# Patient Record
Sex: Female | Born: 1980 | Race: Black or African American | Hispanic: No | Marital: Married | Smoking: Never smoker
Health system: Southern US, Community
[De-identification: ages and names within clinical notes are randomized; demographics above are authoritative.]

## PROBLEM LIST (undated history)

## (undated) ENCOUNTER — Inpatient Hospital Stay (HOSPITAL_COMMUNITY): Payer: Self-pay

## (undated) DIAGNOSIS — I889 Nonspecific lymphadenitis, unspecified: Secondary | ICD-10-CM

## (undated) DIAGNOSIS — I499 Cardiac arrhythmia, unspecified: Secondary | ICD-10-CM

## (undated) DIAGNOSIS — F32A Depression, unspecified: Secondary | ICD-10-CM

## (undated) DIAGNOSIS — I10 Essential (primary) hypertension: Secondary | ICD-10-CM

## (undated) DIAGNOSIS — Z8669 Personal history of other diseases of the nervous system and sense organs: Secondary | ICD-10-CM

## (undated) DIAGNOSIS — N200 Calculus of kidney: Secondary | ICD-10-CM

## (undated) DIAGNOSIS — T7840XA Allergy, unspecified, initial encounter: Secondary | ICD-10-CM

## (undated) DIAGNOSIS — O24419 Gestational diabetes mellitus in pregnancy, unspecified control: Secondary | ICD-10-CM

## (undated) DIAGNOSIS — R011 Cardiac murmur, unspecified: Secondary | ICD-10-CM

## (undated) DIAGNOSIS — K5792 Diverticulitis of intestine, part unspecified, without perforation or abscess without bleeding: Secondary | ICD-10-CM

## (undated) DIAGNOSIS — F329 Major depressive disorder, single episode, unspecified: Secondary | ICD-10-CM

## (undated) HISTORY — DX: Cardiac murmur, unspecified: R01.1

## (undated) HISTORY — PX: BREAST CYST EXCISION: SHX579

## (undated) HISTORY — PX: WISDOM TOOTH EXTRACTION: SHX21

## (undated) HISTORY — DX: Personal history of other diseases of the nervous system and sense organs: Z86.69

## (undated) HISTORY — DX: Diverticulitis of intestine, part unspecified, without perforation or abscess without bleeding: K57.92

## (undated) HISTORY — DX: Gestational diabetes mellitus in pregnancy, unspecified control: O24.419

## (undated) HISTORY — DX: Allergy, unspecified, initial encounter: T78.40XA

---
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
## 2004-08-26 ENCOUNTER — Inpatient Hospital Stay (HOSPITAL_COMMUNITY): Admission: AD | Admit: 2004-08-26 | Discharge: 2004-08-26 | Payer: Self-pay | Admitting: *Deleted

## 2004-08-30 ENCOUNTER — Inpatient Hospital Stay (HOSPITAL_COMMUNITY): Admission: AD | Admit: 2004-08-30 | Discharge: 2004-08-30 | Payer: Self-pay | Admitting: Obstetrics and Gynecology

## 2004-09-06 ENCOUNTER — Inpatient Hospital Stay (HOSPITAL_COMMUNITY): Admission: AD | Admit: 2004-09-06 | Discharge: 2004-09-06 | Payer: Self-pay | Admitting: Obstetrics & Gynecology

## 2004-09-10 ENCOUNTER — Inpatient Hospital Stay (HOSPITAL_COMMUNITY): Admission: AD | Admit: 2004-09-10 | Discharge: 2004-09-10 | Payer: Self-pay | Admitting: *Deleted

## 2004-09-13 ENCOUNTER — Inpatient Hospital Stay (HOSPITAL_COMMUNITY): Admission: AD | Admit: 2004-09-13 | Discharge: 2004-09-13 | Payer: Self-pay | Admitting: Obstetrics and Gynecology

## 2004-12-14 ENCOUNTER — Other Ambulatory Visit: Admission: RE | Admit: 2004-12-14 | Discharge: 2004-12-14 | Payer: Self-pay | Admitting: Obstetrics and Gynecology

## 2005-07-01 ENCOUNTER — Inpatient Hospital Stay (HOSPITAL_COMMUNITY): Admission: AD | Admit: 2005-07-01 | Discharge: 2005-07-03 | Payer: Self-pay | Admitting: Obstetrics and Gynecology

## 2005-12-27 ENCOUNTER — Other Ambulatory Visit: Admission: RE | Admit: 2005-12-27 | Discharge: 2005-12-27 | Payer: Self-pay | Admitting: Obstetrics and Gynecology

## 2009-09-03 ENCOUNTER — Inpatient Hospital Stay (HOSPITAL_COMMUNITY): Admission: RE | Admit: 2009-09-03 | Discharge: 2009-09-05 | Payer: Self-pay | Admitting: Obstetrics and Gynecology

## 2010-06-25 ENCOUNTER — Ambulatory Visit: Payer: Self-pay | Admitting: Diagnostic Radiology

## 2010-06-25 ENCOUNTER — Emergency Department (HOSPITAL_BASED_OUTPATIENT_CLINIC_OR_DEPARTMENT_OTHER): Admission: EM | Admit: 2010-06-25 | Discharge: 2010-06-25 | Payer: Self-pay | Admitting: Emergency Medicine

## 2011-01-06 LAB — BASIC METABOLIC PANEL WITH GFR
BUN: 9 mg/dL (ref 6–23)
CO2: 27 meq/L (ref 19–32)
Calcium: 9 mg/dL (ref 8.4–10.5)
Chloride: 107 meq/L (ref 96–112)
Creatinine, Ser: 0.7 mg/dL (ref 0.4–1.2)
GFR calc Af Amer: >60 mL/min (ref >60)
GFR calc non Af Amer: >60 mL/min (ref >60)
Glucose, Bld: 101 mg/dL — ABNORMAL HIGH (ref 70–99)
Potassium: 3.7 meq/L (ref 3.5–5.1)
Sodium: 142 meq/L (ref 135–145)

## 2011-01-06 LAB — CBC
HCT: 40.8 % (ref 36.0–46.0)
Hemoglobin: 13.3 g/dL (ref 12.0–15.0)
RBC: 4.64 MIL/uL (ref 3.87–5.11)
RDW: 13.1 % (ref 11.5–15.5)
WBC: 6 10*3/uL (ref 4.0–10.5)

## 2011-01-06 LAB — DIFFERENTIAL
Basophils Absolute: 0.1 K/uL (ref 0.0–0.1)
Basophils Relative: 1 % (ref 0–1)
Eosinophils Absolute: 0.2 K/uL (ref 0.0–0.7)
Eosinophils Relative: 3 % (ref 0–5)
Lymphocytes Relative: 43 % (ref 12–46)
Lymphs Abs: 2.6 K/uL (ref 0.7–4.0)
Monocytes Absolute: 0.4 K/uL (ref 0.1–1.0)
Monocytes Relative: 7 % (ref 3–12)
Neutro Abs: 2.7 K/uL (ref 1.7–7.7)
Neutrophils Relative %: 47 % (ref 43–77)

## 2011-01-26 LAB — CBC
HCT: 35.1 % — ABNORMAL LOW (ref 36.0–46.0)
HCT: 39.8 % (ref 36.0–46.0)
Hemoglobin: 13.2 g/dL (ref 12.0–15.0)
MCHC: 33.2 g/dL (ref 30.0–36.0)
MCV: 94 fL (ref 78.0–100.0)
Platelets: 159 10*3/uL (ref 150–400)
RBC: 4.31 MIL/uL (ref 3.87–5.11)
WBC: 10.2 10*3/uL (ref 4.0–10.5)

## 2011-03-11 NOTE — Discharge Summary (Signed)
NAMEDONN, [name]              ACCOUNT NO.:  [id number]   MEDICAL RECORD NO.:  [id number]          PATIENT TYPE:  INP   LOCATION:  9119                          FACILITY:  [facility]   PHYSICIAN:  Huel Cote, M.D. DATE OF BIRTH:  1981/03/23   DATE OF ADMISSION:  07/01/2005  DATE OF DISCHARGE:  07/03/2005                                 DISCHARGE SUMMARY   DISCHARGE DIAGNOSES:  1.  Term pregnancy at 40 weeks delivered.  2.  Group B strep positive status.  3.  Status post normal spontaneous vaginal delivery.  4.  Meconium-stained fluid.   DISCHARGE MEDICATIONS:  1.  Motrin 600 mg p.o. every 6 hours  2.  Percocet 1 to 2 tablets p.o. every 4 hours p.r.n.   DISCHARGE FOLLOWUP:  The patient is to follow-up in the office in  approximately 6 weeks for routine postpartum exam.   HOSPITAL COURSE:  The patient is a 30 year old gravida 4, para 2-0-1-2 who  was admitted 40 plus weeks gestation for induction of labor given post  dates. Her prenatal care had been complicated only by group B strep positive  status.   PRENATAL LABS:  O+ antibody negative, sickle normal, RPR nonreactive,  rubella equivocal, hepatitis B surface antigen negative, HIV nonreactive, GC  negative, chlamydia negative, group B strep positive, 1-hour Glucola 108.   PAST OBSTETRICAL HISTORY:  In 2001 she had a vaginal delivery of 8 pounds 6  ounces infant. In 2004 she had a vaginal delivery of 7 pounds 6 ounces  infant. She had one spontaneous miscarriage at 8 weeks.   PAST GYN HISTORY:  None.   PAST MEDICAL HISTORY:  Anemia.   SURGICAL HISTORY:  None.   ALLERGIES:  No known drug allergies. She did have some GI intolerance to  p.o. PENICILLIN.   MEDICATIONS:  She was taking no medications on admission.   HOSPITAL COURSE:  She was afebrile with stable vital signs. Fetal heart rate  was reactive. Cervical exam was 50, 1+, and -1 station. She had rupture of  membranes performed with light meconium stained fluid  noted. She was placed  on penicillin for the group B strep positive status. She was placed on  Pitocin and progressed well reaching complete dilation and pushed once for a  normal spontaneous vaginal delivery of a viable female infant over intact  perineum. Apgars were 6 and 8. Weight was 8 pounds 3 ounces. There was a  nuchal cord x1. The infant was DeLee suctioned after delivery of head for  thin meconium and did very well. It was not felt necessary for pediatrician  to be  in attendance. Placenta delivered spontaneously and the patient had no  lacerations. She did very well with her postpartum course. On postpartum day  #2 and normal lochia. Her fundus firm and she was afebrile with stable vital  signs. Discharge hemoglobin was 11.5 and she was felt stable for discharge  home.      Huel Cote, M.D.  Electronically Signed     KR/MEDQ  D:  07/03/2005  T:  07/03/2005  Job:  [id number]

## 2011-03-24 ENCOUNTER — Inpatient Hospital Stay (HOSPITAL_COMMUNITY)
Admission: AD | Admit: 2011-03-24 | Discharge: 2011-03-24 | Disposition: A | Discharge status: Home / Self Care | Payer: Self-pay | Source: Ambulatory Visit | Attending: Obstetrics and Gynecology | Admitting: Obstetrics and Gynecology

## 2011-03-24 ENCOUNTER — Inpatient Hospital Stay (HOSPITAL_COMMUNITY): Payer: Self-pay

## 2011-03-24 DIAGNOSIS — O200 Threatened abortion: Secondary | ICD-10-CM

## 2011-03-24 DIAGNOSIS — R58 Hemorrhage, not elsewhere classified: Secondary | ICD-10-CM

## 2011-03-24 LAB — HCG, QUANTITATIVE, PREGNANCY: hCG, Beta Chain, Quant, S: 5316 m[IU]/mL — ABNORMAL HIGH (ref <5)

## 2011-03-24 LAB — URINALYSIS, ROUTINE W REFLEX MICROSCOPIC
Bilirubin Urine: NEGATIVE
Specific Gravity, Urine: 1.025 (ref 1.005–1.030)
Urobilinogen, UA: 0.2 mg/dL (ref 0.0–1.0)
pH: 6 (ref 5.0–8.0)

## 2011-03-24 LAB — WET PREP, GENITAL
Clue Cells Wet Prep HPF POC: NONE SEEN
Trich, Wet Prep: NONE SEEN
Yeast Wet Prep HPF POC: NONE SEEN

## 2011-03-24 LAB — CBC
Hemoglobin: 12.3 g/dL (ref 12.0–15.0)
RBC: 4.23 MIL/uL (ref 3.87–5.11)

## 2011-03-24 LAB — ABO/RH: ABO/RH(D): O POS

## 2011-03-24 LAB — URINE MICROSCOPIC-ADD ON

## 2011-03-31 ENCOUNTER — Other Ambulatory Visit (HOSPITAL_COMMUNITY): Payer: Self-pay | Admitting: Obstetrics and Gynecology

## 2011-03-31 DIAGNOSIS — IMO0002 Reserved for concepts with insufficient information to code with codable children: Secondary | ICD-10-CM

## 2011-04-01 ENCOUNTER — Other Ambulatory Visit (HOSPITAL_COMMUNITY): Payer: Self-pay | Admitting: Obstetrics and Gynecology

## 2011-04-01 ENCOUNTER — Ambulatory Visit (HOSPITAL_COMMUNITY): Admission: RE | Admit: 2011-04-01 | Payer: 59 | Source: Ambulatory Visit

## 2011-04-01 ENCOUNTER — Ambulatory Visit (HOSPITAL_COMMUNITY)
Admission: RE | Admit: 2011-04-01 | Discharge: 2011-04-01 | Disposition: A | Discharge status: Home / Self Care | Payer: 59 | Source: Ambulatory Visit | Attending: Obstetrics and Gynecology | Admitting: Obstetrics and Gynecology

## 2011-04-01 DIAGNOSIS — N938 Other specified abnormal uterine and vaginal bleeding: Secondary | ICD-10-CM | POA: Insufficient documentation

## 2011-04-01 DIAGNOSIS — IMO0002 Reserved for concepts with insufficient information to code with codable children: Secondary | ICD-10-CM

## 2011-04-01 DIAGNOSIS — N949 Unspecified condition associated with female genital organs and menstrual cycle: Secondary | ICD-10-CM | POA: Insufficient documentation

## 2011-04-11 ENCOUNTER — Ambulatory Visit (HOSPITAL_COMMUNITY): Payer: 59

## 2011-10-25 NOTE — L&D Delivery Note (Signed)
Delivery Note I was called for delivery of pt who had epidural but was feeling pressure.  I came immediately but on reaching room within 10 minutes baby was already in the warmer and Dr. Adrian Blackwater had attended the delivery. Pt had unbearable pressure and Dr. Adrian Blackwater called while I was en route. At 4:53 PM a healthy female was delivered via Vaginal, Spontaneous Delivery (Presentation: ; Occiput Anterior).  APGAR: 7, 9; weight pending.   Placenta status: Intact, Spontaneous.  Cord: 3 vessels with the following complications: Nuchal; x 1.  Cord blood was collected and I delivered placenta with no difficulty. Anesthesia: Epidural  Episiotomy: None Lacerations: None Suture Repair: n/a Est. Blood Loss (mL): 350cc  Mom to postpartum.  Baby with mother in room.  [name] 05/22/2012, 5:13 PM

## 2011-10-28 LAB — OB RESULTS CONSOLE HIV ANTIBODY (ROUTINE TESTING): HIV: NONREACTIVE

## 2011-10-28 LAB — OB RESULTS CONSOLE RUBELLA ANTIBODY, IGM: Rubella: IMMUNE

## 2011-10-28 LAB — OB RESULTS CONSOLE ABO/RH

## 2012-01-30 ENCOUNTER — Ambulatory Visit: Payer: 59 | Admitting: Internal Medicine

## 2012-02-09 HISTORY — PX: TRANSTHORACIC ECHOCARDIOGRAM: SHX275

## 2012-02-15 ENCOUNTER — Inpatient Hospital Stay (HOSPITAL_COMMUNITY)
Admission: AD | Admit: 2012-02-15 | Discharge: 2012-02-15 | Disposition: A | Discharge status: Home / Self Care | Payer: 59 | Source: Ambulatory Visit | Attending: Obstetrics and Gynecology | Admitting: Obstetrics and Gynecology

## 2012-02-15 ENCOUNTER — Encounter (HOSPITAL_COMMUNITY): Payer: Self-pay | Admitting: *Deleted

## 2012-02-15 DIAGNOSIS — R5381 Other malaise: Secondary | ICD-10-CM | POA: Insufficient documentation

## 2012-02-15 DIAGNOSIS — R531 Weakness: Secondary | ICD-10-CM

## 2012-02-15 DIAGNOSIS — R5383 Other fatigue: Secondary | ICD-10-CM

## 2012-02-15 DIAGNOSIS — M7989 Other specified soft tissue disorders: Secondary | ICD-10-CM

## 2012-02-15 DIAGNOSIS — IMO0002 Reserved for concepts with insufficient information to code with codable children: Secondary | ICD-10-CM | POA: Insufficient documentation

## 2012-02-15 HISTORY — DX: Cardiac arrhythmia, unspecified: I49.9

## 2012-02-15 NOTE — MAU Note (Signed)
Cardiologist is at [facility]

## 2012-02-15 NOTE — MAU Note (Addendum)
While pt was at work about 1500 felt heart racing, SOB, diaphoretic, and pain on rt low abd.  Pt. States BP 140/91 with HR of 148 at that time and currently wears a heart monitor.  Pt call Cardiologist with [facility] and is awaiting return call when [facility] office instructed her to come to MAU.  Pt says the SOB has lessened and is much cooler now.

## 2012-02-15 NOTE — MAU Provider Note (Signed)
History     CSN: [id number]  Arrival date and time: 02/15/12 1719   First Provider Initiated Contact with Patient 02/15/12 1806      No chief complaint on file.  HPI [name] Guerra 31 y.o. [redacted]w[redacted]d  Comes to MAU after leaving work at [employer].  Had just arrived at work when began experiencing being hot, sweating, and feeling like her chest was heavy.  Took her vital signs and her blood pressure was 141/90 and HR was 148.  On arrival to MAU BP was 136/76 and HR was 116 after driving herself to pick up her 4 children and coming to [facility].  Has swollen left leg and long history of swelling in that leg - possible lymphedema since a teen.  OB History    Grav Para Term Preterm Abortions TAB SAB Ect Mult Living   7 4   2  2   4       Past Medical History  Diagnosis Date  . Dysrhythmia     Past Surgical History  Procedure Date  . Breast cyst excision   . Wisdom tooth extraction     Family History  Problem Relation Age of Onset  . Anesthesia problems Neg Hx     History  Substance Use Topics  . Smoking status: Never Smoker   . Smokeless tobacco: Never Used  . Alcohol Use: No    Allergies: No Known Allergies  No prescriptions prior to admission    Review of Systems  Constitutional: Positive for diaphoresis.  Cardiovascular:       Heaviness in chest Swelling in left leg High heart rate and blood pressure  Neurological: Positive for weakness.   Physical Exam   Blood pressure 136/76, pulse 116, temperature 97.8 F (36.6 C), temperature source Oral, resp. rate 20, height 5\' 7"  (1.702 m), weight 242 lb (109.77 kg), last menstrual period 08/18/2011, SpO2 100.00%.  Physical Exam  Nursing note and vitals reviewed. Constitutional: She is oriented to person, place, and time. She appears well-developed and well-nourished.  HENT:  Head: Normocephalic.  Eyes: EOM are normal.  Neck: Neck supple.  GI: Soft. There is no tenderness.       No contractions  seen on monitor FHT baseline 145 with 10x10 accels noted - reassuring  Musculoskeletal: Normal range of motion.       Left leg obviously more edematous than right  Neurological: She is alert and oriented to person, place, and time.  Skin: Skin is warm and dry.  Psychiatric: She has a normal mood and affect.    MAU Course  Procedures Results for orders placed during the hospital encounter of 02/15/12 (from the past 24 hour(s))  GLUCOSE, CAPILLARY     Status: Normal   Collection Time   02/15/12  5:51 PM      Component Value Range   Glucose-Capillary 95  70 - 99 (mg/dL)    MDM 7829  Consult with Dr. Ellyn Hack re: plan of care Client was seen on Thursday at [facility] for echo on heart and doppler of left leg  Assessment and Plan  Pregnancy 25 weeks Episode of weakness - currently stable with BP around 120/80 and heart rate of 97-103. Left leg swelling  Plan Keep your appointments in the office as scheduled Call your doctor if you have questions or concerns. Decrease carbohydrates in your diet and eat protein every time you eat. - examples are meat, milk, cheese, eggs, nuts, peanut butter Stop sugary  drinks.  They may be contributing to a high blood sugar that drops and leaves you feeling weak.  Yaremi Stahlman 02/15/2012, 6:13 PM

## 2012-02-15 NOTE — Discharge Instructions (Signed)
Keep your appointments in the office as scheduled Call your doctor if you have questions or concerns. Decrease carbohydrates in your diet and eat protein every time you eat. - examples are meat, milk, cheese, eggs, nuts, peanut butter Stop sugary drinks.  They may be contributing to a high blood sugar that drops and leaves you feeling weak.

## 2012-04-25 ENCOUNTER — Ambulatory Visit: Payer: 59

## 2012-05-11 ENCOUNTER — Telehealth (HOSPITAL_COMMUNITY): Payer: Self-pay | Admitting: *Deleted

## 2012-05-11 ENCOUNTER — Encounter (HOSPITAL_COMMUNITY): Payer: Self-pay | Admitting: *Deleted

## 2012-05-11 NOTE — Telephone Encounter (Signed)
Preadmission screen  

## 2012-05-21 NOTE — H&P (Signed)
[name] Guerra is a 31 y.o. female  [phone] at 39+weeks (EDD by LMP c/w 8 week [place]) presents for induction of labor at term with favorable cervix.  Prenatal care complicated by gestational diabetes that is well-controlled by diet. She also had palpitations early in pregnacy with a negative cardiology w/u.  No other significant issues.  Maternal Medical History:  Prenatal Complications - Diabetes: gestational. Diabetes is managed by diet.      OB History    Grav Para Term Preterm Abortions TAB SAB Ect Mult Living   7 4 4  2  2   4     2001 NSVD 8#6oz 2004 NSVD 7#6oz 2005 SAB 2006 NSVD 8#3oz 2010 NSVD 8#2oz 2012 SAB  Past Medical History  Diagnosis Date  . Dysrhythmia   . Gestational diabetes     diet controlled  . Diverticulitis   . Hx of Bell's palsy     as newborn   Past Surgical History  Procedure Date  . Breast cyst excision   . Wisdom tooth extraction    Family History: family history includes Cancer in her maternal uncle; Diabetes in her maternal aunt, maternal grandfather, maternal grandmother, maternal uncle, and mother; Heart disease in her maternal aunt and maternal grandfather; and Hypertension in her maternal grandfather.  There is no history of Anesthesia problems. Social History:  reports that she has never smoked. She has never used smokeless tobacco. She reports that she does not drink alcohol or use illicit drugs.   Prenatal Transfer Tool  Maternal Diabetes: Yes:  Diabetes Type:  Diet controlled Genetic Screening: Normal Maternal Ultrasounds/Referrals: Normal Fetal Ultrasounds or other Referrals:  None Maternal Substance Abuse:  No Significant Maternal Medications:  None Significant Maternal Lab Results:  None Other Comments:  None  ROS    Last menstrual period 08/18/2011. Maternal Exam:  Uterine Assessment: Contraction strength is mild.  Contraction frequency is irregular.   Abdomen: Patient reports no abdominal tenderness. Fetal presentation:  vertex  Introitus: Normal vulva. Normal vagina.    Physical Exam  Constitutional: She is oriented to person, place, and time. She appears well-developed and well-nourished.  Cardiovascular: Normal rate and regular rhythm.   Respiratory: Effort normal and breath sounds normal.  GI: Soft. Bowel sounds are normal.  Genitourinary: Vagina normal and uterus normal.  Musculoskeletal: Normal range of motion.  Neurological: She is alert and oriented to person, place, and time.  Psychiatric: She has a normal mood and affect. Her behavior is normal.    Prenatal labs: ABO, Rh: O/Positive/-- (01/04 0000) Antibody: Negative (01/04 0000) Rubella: Immune (01/04 0000) RPR: Nonreactive (01/04 0000)  HBsAg: Negative (01/04 0000)  HIV: Non-reactive (01/04 0000)  GBS: Negative (07/03 0000)  First one hour GTT 215 3 hour GTT WNL at 28 weeks Repeat one hour GTT 170 at 33 weeks began fingersticks First trimester screen and AFP WNL Hgb AA  Assessment/Plan: Pt for induction of labor at term.  Plan pitocin and AROM. Pt declines epidural. Plans to use IV meds.   [name] 05/21/2012, 9:10 PM

## 2012-05-22 ENCOUNTER — Inpatient Hospital Stay (HOSPITAL_COMMUNITY)
Admission: RE | Admit: 2012-05-22 | Discharge: 2012-05-24 | DRG: 775 | Disposition: A | Discharge status: Home / Self Care | Payer: 59 | Source: Ambulatory Visit | Attending: Obstetrics and Gynecology | Admitting: Obstetrics and Gynecology

## 2012-05-22 ENCOUNTER — Encounter (HOSPITAL_COMMUNITY): Payer: Self-pay

## 2012-05-22 ENCOUNTER — Inpatient Hospital Stay (HOSPITAL_COMMUNITY): Payer: 59 | Admitting: Anesthesiology

## 2012-05-22 ENCOUNTER — Encounter (HOSPITAL_COMMUNITY): Payer: Self-pay | Admitting: Anesthesiology

## 2012-05-22 DIAGNOSIS — O99814 Abnormal glucose complicating childbirth: Principal | ICD-10-CM | POA: Diagnosis present

## 2012-05-22 LAB — BASIC METABOLIC PANEL
BUN: 6 mg/dL (ref 6–23)
CO2: 23 mEq/L (ref 19–32)
Chloride: 105 mEq/L (ref 96–112)
Creatinine, Ser: 0.51 mg/dL (ref 0.50–1.10)
GFR calc Af Amer: >90 mL/min (ref >90)
Potassium: 3.7 mEq/L (ref 3.5–5.1)

## 2012-05-22 LAB — GLUCOSE, CAPILLARY: Glucose-Capillary: 81 mg/dL (ref 70–99)

## 2012-05-22 LAB — TYPE AND SCREEN
ABO/RH(D): O POS
Antibody Screen: NEGATIVE

## 2012-05-22 LAB — CBC
HCT: 37.4 % (ref 36.0–46.0)
Hemoglobin: 12.2 g/dL (ref 12.0–15.0)
MCH: 28.2 pg (ref 26.0–34.0)
MCV: 86.6 fL (ref 78.0–100.0)
Platelets: 166 10*3/uL (ref 150–400)
RBC: 4.32 MIL/uL (ref 3.87–5.11)
WBC: 6.3 10*3/uL (ref 4.0–10.5)

## 2012-05-22 MED ORDER — EPHEDRINE 5 MG/ML INJ
10.0000 mg | INTRAVENOUS | Status: DC | PRN
  Filled 2012-05-22: qty 4

## 2012-05-22 MED ORDER — OXYTOCIN 40 UNITS IN LACTATED RINGERS INFUSION - SIMPLE MED: 62.5000 mL/h | Freq: Once | INTRAVENOUS | Status: DC

## 2012-05-22 MED ORDER — TERBUTALINE SULFATE 1 MG/ML IJ SOLN: 0.2500 mg | Freq: Once | INTRAMUSCULAR | Status: DC | PRN

## 2012-05-22 MED ORDER — DIPHENHYDRAMINE HCL 25 MG PO CAPS: 25.0000 mg | ORAL_CAPSULE | Freq: Four times a day (QID) | ORAL | Status: DC | PRN

## 2012-05-22 MED ORDER — OXYTOCIN 40 UNITS IN LACTATED RINGERS INFUSION - SIMPLE MED
1.0000 m[IU]/min | INTRAVENOUS | Status: DC
  Administered 2012-05-22: 4 m[IU]/min via INTRAVENOUS
  Administered 2012-05-22: 8 m[IU]/min via INTRAVENOUS
  Administered 2012-05-22: 6 m[IU]/min via INTRAVENOUS
  Administered 2012-05-22: 2 m[IU]/min via INTRAVENOUS
  Administered 2012-05-22: 12 m[IU]/min via INTRAVENOUS
  Administered 2012-05-22: 10 m[IU]/min via INTRAVENOUS

## 2012-05-22 MED ORDER — OXYTOCIN BOLUS FROM INFUSION
250.0000 mL | Freq: Once | INTRAVENOUS | Status: DC
  Filled 2012-05-22: qty 500

## 2012-05-22 MED ORDER — PHENYLEPHRINE 40 MCG/ML (10ML) SYRINGE FOR IV PUSH (FOR BLOOD PRESSURE SUPPORT): 80.0000 ug | PREFILLED_SYRINGE | INTRAVENOUS | Status: DC | PRN

## 2012-05-22 MED ORDER — OXYCODONE-ACETAMINOPHEN 5-325 MG PO TABS: 1.0000 | ORAL_TABLET | ORAL | Status: DC | PRN

## 2012-05-22 MED ORDER — PHENYLEPHRINE 40 MCG/ML (10ML) SYRINGE FOR IV PUSH (FOR BLOOD PRESSURE SUPPORT)
80.0000 ug | PREFILLED_SYRINGE | INTRAVENOUS | Status: DC | PRN
  Filled 2012-05-22: qty 5

## 2012-05-22 MED ORDER — DIBUCAINE 1 % RE OINT: 1.0000 "application " | TOPICAL_OINTMENT | RECTAL | Status: DC | PRN

## 2012-05-22 MED ORDER — IBUPROFEN 600 MG PO TABS
600.0000 mg | ORAL_TABLET | Freq: Four times a day (QID) | ORAL | Status: DC
  Administered 2012-05-23 – 2012-05-24 (×7): 600 mg via ORAL
  Filled 2012-05-22 (×7): qty 1

## 2012-05-22 MED ORDER — ONDANSETRON HCL 4 MG/2ML IJ SOLN: 4.0000 mg | INTRAMUSCULAR | Status: DC | PRN

## 2012-05-22 MED ORDER — LACTATED RINGERS IV SOLN
500.0000 mL | INTRAVENOUS | Status: DC | PRN
  Administered 2012-05-22: 1000 mL via INTRAVENOUS

## 2012-05-22 MED ORDER — ACETAMINOPHEN 325 MG PO TABS: 650.0000 mg | ORAL_TABLET | ORAL | Status: DC | PRN

## 2012-05-22 MED ORDER — LIDOCAINE HCL (PF) 1 % IJ SOLN: 30.0000 mL | INTRAMUSCULAR | Status: DC | PRN

## 2012-05-22 MED ORDER — TETANUS-DIPHTH-ACELL PERTUSSIS 5-2.5-18.5 LF-MCG/0.5 IM SUSP: 0.5000 mL | Freq: Once | INTRAMUSCULAR | Status: DC

## 2012-05-22 MED ORDER — FLEET ENEMA 7-19 GM/118ML RE ENEM: 1.0000 | ENEMA | RECTAL | Status: DC | PRN

## 2012-05-22 MED ORDER — OXYTOCIN 40 UNITS IN LACTATED RINGERS INFUSION - SIMPLE MED
62.5000 mL/h | Freq: Once | INTRAVENOUS | Status: AC
  Administered 2012-05-22: 62.5 mL/h via INTRAVENOUS
  Filled 2012-05-22: qty 1000

## 2012-05-22 MED ORDER — FENTANYL 2.5 MCG/ML BUPIVACAINE 1/10 % EPIDURAL INFUSION (WH - ANES)
14.0000 mL/h | INTRAMUSCULAR | Status: DC
  Administered 2012-05-22: 14 mL/h via EPIDURAL
  Filled 2012-05-22: qty 60

## 2012-05-22 MED ORDER — ZOLPIDEM TARTRATE 5 MG PO TABS: 5.0000 mg | ORAL_TABLET | Freq: Every evening | ORAL | Status: DC | PRN

## 2012-05-22 MED ORDER — BENZOCAINE-MENTHOL 20-0.5 % EX AERO: 1.0000 "application " | INHALATION_SPRAY | CUTANEOUS | Status: DC | PRN

## 2012-05-22 MED ORDER — OXYCODONE-ACETAMINOPHEN 5-325 MG PO TABS
1.0000 | ORAL_TABLET | ORAL | Status: DC | PRN
  Administered 2012-05-22 – 2012-05-23 (×2): 1 via ORAL
  Administered 2012-05-24: 2 via ORAL
  Filled 2012-05-22 (×2): qty 1
  Filled 2012-05-22: qty 2

## 2012-05-22 MED ORDER — WITCH HAZEL-GLYCERIN EX PADS: 1.0000 "application " | MEDICATED_PAD | CUTANEOUS | Status: DC | PRN

## 2012-05-22 MED ORDER — CITRIC ACID-SODIUM CITRATE 334-500 MG/5ML PO SOLN: 30.0000 mL | ORAL | Status: DC | PRN

## 2012-05-22 MED ORDER — LACTATED RINGERS IV SOLN: 500.0000 mL | INTRAVENOUS | Status: DC | PRN

## 2012-05-22 MED ORDER — SIMETHICONE 80 MG PO CHEW: 80.0000 mg | CHEWABLE_TABLET | ORAL | Status: DC | PRN

## 2012-05-22 MED ORDER — IBUPROFEN 600 MG PO TABS: 600.0000 mg | ORAL_TABLET | Freq: Four times a day (QID) | ORAL | Status: DC | PRN

## 2012-05-22 MED ORDER — LANOLIN HYDROUS EX OINT: TOPICAL_OINTMENT | CUTANEOUS | Status: DC | PRN

## 2012-05-22 MED ORDER — LIDOCAINE HCL (PF) 1 % IJ SOLN
INTRAMUSCULAR | Status: DC | PRN
  Administered 2012-05-22 (×2): 5 mL

## 2012-05-22 MED ORDER — ONDANSETRON HCL 4 MG PO TABS: 4.0000 mg | ORAL_TABLET | ORAL | Status: DC | PRN

## 2012-05-22 MED ORDER — ONDANSETRON HCL 4 MG/2ML IJ SOLN
4.0000 mg | Freq: Four times a day (QID) | INTRAMUSCULAR | Status: DC | PRN
  Filled 2012-05-22: qty 2

## 2012-05-22 MED ORDER — LACTATED RINGERS IV SOLN
INTRAVENOUS | Status: DC
  Administered 2012-05-22: 1000 mL via INTRAVENOUS

## 2012-05-22 MED ORDER — DIPHENHYDRAMINE HCL 50 MG/ML IJ SOLN: 12.5000 mg | INTRAMUSCULAR | Status: DC | PRN

## 2012-05-22 MED ORDER — LACTATED RINGERS IV SOLN: INTRAVENOUS | Status: DC

## 2012-05-22 MED ORDER — ONDANSETRON HCL 4 MG/2ML IJ SOLN: 4.0000 mg | Freq: Four times a day (QID) | INTRAMUSCULAR | Status: DC | PRN

## 2012-05-22 MED ORDER — BUTORPHANOL TARTRATE 1 MG/ML IJ SOLN
1.0000 mg | INTRAMUSCULAR | Status: DC | PRN
  Administered 2012-05-22 (×2): 1 mg via INTRAVENOUS
  Filled 2012-05-22 (×2): qty 1

## 2012-05-22 MED ORDER — EPHEDRINE 5 MG/ML INJ: 10.0000 mg | INTRAVENOUS | Status: DC | PRN

## 2012-05-22 MED ORDER — SENNOSIDES-DOCUSATE SODIUM 8.6-50 MG PO TABS
2.0000 | ORAL_TABLET | Freq: Every day | ORAL | Status: DC
  Administered 2012-05-22 – 2012-05-23 (×2): 2 via ORAL

## 2012-05-22 MED ORDER — OXYTOCIN BOLUS FROM INFUSION
250.0000 mL | Freq: Once | INTRAVENOUS | Status: AC
  Administered 2012-05-22: 250 mL via INTRAVENOUS
  Filled 2012-05-22: qty 500

## 2012-05-22 MED ORDER — PRENATAL MULTIVITAMIN CH
1.0000 | ORAL_TABLET | Freq: Every day | ORAL | Status: DC
  Administered 2012-05-23 – 2012-05-24 (×2): 1 via ORAL
  Filled 2012-05-22 (×3): qty 1

## 2012-05-22 MED ORDER — LACTATED RINGERS IV SOLN: 500.0000 mL | Freq: Once | INTRAVENOUS | Status: DC

## 2012-05-22 NOTE — Progress Notes (Signed)
   Subjective: Pt was difficult IV start, just got it in  Occasional contractions  Objective: BP 125/84  Temp 97.6 F (36.4 C)  Resp 18  Ht 5\' 8"  (1.727 m)  Wt 115.667 kg (255 lb)  BMI 38.77 kg/m2  LMP 08/18/2011      FHT:  FHR: 130 bpm, variability: moderate,  accelerations:  Present,  decelerations:  Absent UC:   irregular, every 10 minutes SVE:   Dilation: 2 Effacement (%): 50 Station: -2 AROM light meconium  Labs: Lab Results  Component Value Date   WBC 5.4 03/24/2011   HGB 12.3 03/24/2011   HCT 36.6 03/24/2011   MCV 86.5 03/24/2011   PLT 192 03/24/2011    Assessment / Plan: Pitocin to be started. Plans IV pain meds D/w pt and husband light meconium, [name] Guerra W 05/22/2012, 9:10 AM

## 2012-05-22 NOTE — Progress Notes (Signed)
   Subjective: Pt doing well, getting uncomfortable and requesting IV pain meds  Objective: BP 126/78  Pulse 100  Temp 98.2 F (36.8 C) (Oral)  Resp 20  Ht 5\' 8"  (1.727 m)  Wt 115.667 kg (255 lb)  BMI 38.77 kg/m2  LMP 08/18/2011      FHT:  FHR: 135 bpm, variability: moderate,  accelerations:  Present,  decelerations:  Absent UC:   regular, every 3-5 minutes SVE:   Dilation: 3.5 Effacement (%): 70 Station: -2 Exam by:: Dr Senaida Ores  Labs: Lab Results  Component Value Date   WBC 6.3 05/22/2012   HGB 12.2 05/22/2012   HCT 37.4 05/22/2012   MCV 86.6 05/22/2012   PLT 166 05/22/2012    Assessment / Plan: Pt on pitocin getting uncomfortable--will get IV pain meds Follow progress Kwasi Joung W 05/22/2012, 1:46 PM

## 2012-05-22 NOTE — Progress Notes (Signed)
Patient ID: [name] Guerra, female   DOB: [birth year], 31 y.o.   MRN: [id number]  Called by nursing to patient's room due to rapid progression of labor. Infant at +4 station upon my arrival.  Mother pushed once and delivered viable female infant.  Cord clamped and cut and baby delivered to warmer.  Cord blood obtained.  Dr Senaida Ores arrived during obtaining cord blood and assumed care of patient.  Levie Heritage, DO 05/22/2012 5:02 PM

## 2012-05-22 NOTE — Anesthesia Preprocedure Evaluation (Signed)
Anesthesia Evaluation  Patient identified by MRN, date of birth, ID band Patient awake    Reviewed: Allergy & Precautions, H&P , Patient's Chart, lab work & pertinent test results  Airway Mallampati: II TM Distance: >3 FB Neck ROM: full    Dental No notable dental hx.    Pulmonary neg pulmonary ROS,  breath sounds clear to auscultation  Pulmonary exam normal       Cardiovascular negative cardio ROS  + dysrhythmias Rhythm:regular Rate:Normal     Neuro/Psych negative neurological ROS  negative psych ROS   GI/Hepatic negative GI ROS, Neg liver ROS,   Endo/Other  negative endocrine ROSMorbid obesity  Renal/GU negative Renal ROS     Musculoskeletal   Abdominal   Peds  Hematology negative hematology ROS (+)   Anesthesia Other Findings Dysrhythmia     Gestational diabetes   diet controlled    Diverticulitis     Hx of Bell's palsy   as newborn    Reproductive/Obstetrics (+) Pregnancy                           Anesthesia Physical Anesthesia Plan  ASA: III  Anesthesia Plan: Epidural   Post-op Pain Management:    Induction:   Airway Management Planned:   Additional Equipment:   Intra-op Plan:   Post-operative Plan:   Informed Consent: I have reviewed the patients History and Physical, chart, labs and discussed the procedure including the risks, benefits and alternatives for the proposed anesthesia with the patient or authorized representative who has indicated his/her understanding and acceptance.     Plan Discussed with:   Anesthesia Plan Comments:         Anesthesia Quick Evaluation

## 2012-05-22 NOTE — Anesthesia Procedure Notes (Signed)
Epidural Patient location during procedure: OB Start time: 05/22/2012 3:42 PM  Staffing Anesthesiologist: Brayton Caves R Performed by: anesthesiologist   Preanesthetic Checklist Completed: patient identified, site marked, surgical consent, pre-op evaluation, timeout performed, IV checked, risks and benefits discussed and monitors and equipment checked  Epidural Patient position: sitting Prep: site prepped and draped and DuraPrep Patient monitoring: continuous pulse ox and blood pressure Approach: midline Injection technique: LOR air and LOR saline  Needle:  Needle type: Tuohy  Needle gauge: 17 G Needle length: 9 cm Needle insertion depth: 7 cm Catheter type: closed end flexible Catheter size: 19 Gauge Catheter at skin depth: 11 cm Test dose: negative  Assessment Events: blood not aspirated, injection not painful, no injection resistance, negative IV test and no paresthesia  Additional Notes Patient identified.  Risk benefits discussed including failed block, incomplete pain control, headache, nerve damage, paralysis, blood pressure changes, nausea, vomiting, reactions to medication both toxic or allergic, and postpartum back pain.  Patient expressed understanding and wished to proceed.  All questions were answered.  Sterile technique used throughout procedure and epidural site dressed with sterile barrier dressing. No paresthesia or other complications noted.The patient did not experience any signs of intravascular injection such as tinnitus or metallic taste in mouth nor signs of intrathecal spread such as rapid motor block. Please see nursing notes for vital signs.

## 2012-05-23 LAB — CBC
Hemoglobin: 11.8 g/dL — ABNORMAL LOW (ref 12.0–15.0)
MCH: 28.7 pg (ref 26.0–34.0)
MCHC: 33.3 g/dL (ref 30.0–36.0)
MCV: 86.1 fL (ref 78.0–100.0)
RBC: 4.11 MIL/uL (ref 3.87–5.11)

## 2012-05-23 NOTE — Anesthesia Postprocedure Evaluation (Signed)
  Anesthesia Post-op Note  Patient: [name] Guerra  Procedure(s) Performed: * No procedures listed *  Patient Location: Mother/Baby  Anesthesia Type: Epidural  Level of Consciousness: awake, alert  and oriented  Airway and Oxygen Therapy: Patient Spontanous Breathing  Post-op Pain: none  Post-op Assessment: Post-op Vital signs reviewed, Patient's Cardiovascular Status Stable, No headache, No backache, No residual numbness and No residual motor weakness  Post-op Vital Signs: Reviewed and stable  Complications: No apparent anesthesia complications

## 2012-05-23 NOTE — Progress Notes (Signed)
Post Partum Day 1 Subjective: no complaints, up ad lib and tolerating PO  Objective: Blood pressure 112/74, pulse 97, temperature 98.2 F (36.8 C), temperature source Other (Comment), resp. rate 18, height 5\' 8"  (1.727 m), weight 115.667 kg (255 lb), last menstrual period 08/18/2011, SpO2 98.00%, unknown if currently breastfeeding.  Physical Exam:  General: alert and cooperative Lochia: appropriate Uterine Fundus: firm     Basename 05/23/12 0530 05/22/12 0856  HGB 11.8* 12.2  HCT 35.4* 37.4    Assessment/Plan: Plan for discharge tomorrow   LOS: 1 day   [name] Guerra 05/23/2012, 8:59 AM

## 2012-05-24 ENCOUNTER — Encounter (HOSPITAL_COMMUNITY): Payer: Self-pay

## 2012-05-24 MED ORDER — OXYCODONE-ACETAMINOPHEN 5-325 MG PO TABS: 1.0000 | ORAL_TABLET | Freq: Four times a day (QID) | ORAL | Status: AC | PRN

## 2012-05-24 MED ORDER — PRENATAL MULTIVITAMIN CH: 1.0000 | ORAL_TABLET | Freq: Every day | ORAL | Status: DC

## 2012-05-24 MED ORDER — IBUPROFEN 800 MG PO TABS: 800.0000 mg | ORAL_TABLET | Freq: Three times a day (TID) | ORAL | Status: AC | PRN

## 2012-05-24 NOTE — Progress Notes (Signed)
Post Partum Day 2 Subjective: no complaints, up ad lib, tolerating PO and nl lochia, pain controlled  Objective: Blood pressure 117/78, pulse 97, temperature 98.6 F (37 C), temperature source Oral, resp. rate 18, height 5\' 8"  (1.727 m), weight 115.667 kg (255 lb), last menstrual period 08/18/2011, SpO2 99.00%, unknown if currently breastfeeding.  Physical Exam:  General: alert and no distress Lochia: appropriate Uterine Fundus: firm   Basename 05/23/12 0530 05/22/12 0856  HGB 11.8* 12.2  HCT 35.4* 37.4    Assessment/Plan: Discharge home.  D/C with motrin, percocet, PNV, f/u 6 weeks   LOS: 2 days   BOVARD,[name] Guerra 05/24/2012, 9:33 AM

## 2012-05-24 NOTE — Discharge Summary (Signed)
Obstetric Discharge Summary Reason for Admission: induction of labor Prenatal Procedures: none Intrapartum Procedures: spontaneous vaginal delivery Postpartum Procedures: none Complications-Operative and Postpartum: none Hemoglobin  Date Value Range Status  05/23/2012 11.8* 12.0 - 15.0 g/dL Final     HCT  Date Value Range Status  05/23/2012 35.4* 36.0 - 46.0 % Final    Physical Exam:  General: alert and no distress Lochia: appropriate Uterine Fundus: firm  Discharge Diagnoses: Term Pregnancy-delivered  Discharge Information: Date: 05/24/2012 Activity: pelvic rest Diet: routine Medications: PNV, Ibuprofen and Percocet Condition: stable Instructions: refer to practice specific booklet Discharge to: home Follow-up Information    Follow up with BOVARD,Myley Bahner, MD. Schedule an appointment as soon as possible for a visit in 6 weeks.   Contact information:   [address] [facility] [address] [place] [address] [phone]          Newborn Data: Live born female  Birth Weight: 9 lb 3 oz (4167 g) APGAR: 7, 9  Home with mother.  BOVARD,Taffany Heiser 05/24/2012, 9:44 AM

## 2012-05-28 ENCOUNTER — Encounter (HOSPITAL_COMMUNITY)
Admission: RE | Admit: 2012-05-28 | Discharge: 2012-05-28 | Disposition: A | Discharge status: Home / Self Care | Payer: 59 | Source: Ambulatory Visit | Attending: Obstetrics and Gynecology | Admitting: Obstetrics and Gynecology

## 2012-05-28 DIAGNOSIS — O923 Agalactia: Secondary | ICD-10-CM | POA: Insufficient documentation

## 2012-06-28 ENCOUNTER — Encounter (HOSPITAL_COMMUNITY)
Admission: RE | Admit: 2012-06-28 | Discharge: 2012-06-28 | Disposition: A | Discharge status: Home / Self Care | Payer: 59 | Source: Ambulatory Visit | Attending: Obstetrics and Gynecology | Admitting: Obstetrics and Gynecology

## 2012-06-28 DIAGNOSIS — O923 Agalactia: Secondary | ICD-10-CM | POA: Insufficient documentation

## 2012-07-29 ENCOUNTER — Encounter (HOSPITAL_COMMUNITY)
Admission: RE | Admit: 2012-07-29 | Discharge: 2012-07-29 | Disposition: A | Discharge status: Home / Self Care | Payer: 59 | Source: Ambulatory Visit | Attending: Obstetrics and Gynecology | Admitting: Obstetrics and Gynecology

## 2012-07-29 DIAGNOSIS — O923 Agalactia: Secondary | ICD-10-CM | POA: Insufficient documentation

## 2012-08-29 ENCOUNTER — Encounter (HOSPITAL_COMMUNITY)
Admission: RE | Admit: 2012-08-29 | Discharge: 2012-08-29 | Disposition: A | Discharge status: Home / Self Care | Payer: 59 | Source: Ambulatory Visit | Attending: Obstetrics and Gynecology | Admitting: Obstetrics and Gynecology

## 2012-08-29 DIAGNOSIS — O923 Agalactia: Secondary | ICD-10-CM | POA: Insufficient documentation

## 2012-09-28 ENCOUNTER — Encounter (HOSPITAL_COMMUNITY)
Admission: RE | Admit: 2012-09-28 | Discharge: 2012-09-28 | Disposition: A | Discharge status: Home / Self Care | Payer: 59 | Source: Ambulatory Visit | Attending: Obstetrics and Gynecology | Admitting: Obstetrics and Gynecology

## 2012-09-28 DIAGNOSIS — O923 Agalactia: Secondary | ICD-10-CM | POA: Insufficient documentation

## 2012-10-29 ENCOUNTER — Encounter (HOSPITAL_COMMUNITY)
Admission: RE | Admit: 2012-10-29 | Discharge: 2012-10-29 | Disposition: A | Discharge status: Home / Self Care | Payer: 59 | Source: Ambulatory Visit | Attending: Obstetrics and Gynecology | Admitting: Obstetrics and Gynecology

## 2012-10-29 DIAGNOSIS — O923 Agalactia: Secondary | ICD-10-CM | POA: Insufficient documentation

## 2012-10-30 ENCOUNTER — Other Ambulatory Visit: Payer: Self-pay | Admitting: Obstetrics and Gynecology

## 2012-10-30 ENCOUNTER — Inpatient Hospital Stay (HOSPITAL_COMMUNITY)
Admission: AD | Admit: 2012-10-30 | Discharge: 2012-10-30 | Disposition: A | Discharge status: Home / Self Care | Payer: 59 | Source: Ambulatory Visit | Attending: Obstetrics and Gynecology | Admitting: Obstetrics and Gynecology

## 2012-10-30 ENCOUNTER — Encounter (HOSPITAL_COMMUNITY): Payer: Self-pay | Admitting: *Deleted

## 2012-10-30 DIAGNOSIS — O9122 Nonpurulent mastitis associated with the puerperium: Secondary | ICD-10-CM

## 2012-10-30 DIAGNOSIS — N6452 Nipple discharge: Secondary | ICD-10-CM

## 2012-10-30 MED ORDER — IBUPROFEN 600 MG PO TABS
600.0000 mg | ORAL_TABLET | Freq: Once | ORAL | Status: AC
  Administered 2012-10-30: 600 mg via ORAL
  Filled 2012-10-30: qty 1

## 2012-10-30 MED ORDER — IBUPROFEN 600 MG PO TABS: 600.0000 mg | ORAL_TABLET | Freq: Four times a day (QID) | ORAL | Status: DC | PRN

## 2012-10-30 MED ORDER — CEPHALEXIN 500 MG PO CAPS: 500.0000 mg | ORAL_CAPSULE | Freq: Four times a day (QID) | ORAL | Status: DC

## 2012-10-30 MED ORDER — CEPHALEXIN 500 MG PO CAPS
500.0000 mg | ORAL_CAPSULE | Freq: Once | ORAL | Status: AC
  Administered 2012-10-30: 500 mg via ORAL
  Filled 2012-10-30: qty 1

## 2012-10-30 NOTE — MAU Provider Note (Signed)
History     CSN: [id number]  Arrival date and time: 10/30/12 4098   First Provider Initiated Contact with Patient 10/30/12 0059      Chief Complaint  Patient presents with  . Breast Problem   HPI This is a 32 y.o. female who is breastfeeding and reports having recently started weaning from breastfeeding. Developed engorgement and fever a few days ago. Has always had problems with Right breast and felt something pop then saw blood in milk.  Had a sore hard area on the right breast then tried to express and pump with good results.  Has never put baby on breast, has always used pump. Has suddenly moved from several pumps per day to one.  Brought in jar of milk which is dark red.  Only bled from right side.  RN Note: Pt post vag delivery 05/22/2012, breastfeeding. Has been feeling achy and tired since 10/24/2012, sore breast, right side hurts worse. Tonight pumped bloody milk with clots. Pt took antibiotics about 1 month ago for possible yeast infection on the breast  OB History    Grav Para Term Preterm Abortions TAB SAB Ect Mult Living   7 5 5  2  2   5       Past Medical History  Diagnosis Date  . Dysrhythmia   . Gestational diabetes     diet controlled  . Diverticulitis   . Hx of Bell's palsy     as newborn  . SVD (spontaneous vaginal delivery) 05/24/2012    Past Surgical History  Procedure Date  . Breast cyst excision   . Wisdom tooth extraction     Family History  Problem Relation Age of Onset  . Anesthesia problems Neg Hx   . Diabetes Mother   . Heart disease Maternal Aunt   . Diabetes Maternal Aunt   . Diabetes Maternal Uncle   . Cancer Maternal Uncle     bone  . Diabetes Maternal Grandmother   . Hypertension Maternal Grandfather   . Heart disease Maternal Grandfather   . Diabetes Maternal Grandfather     History  Substance Use Topics  . Smoking status: Never Smoker   . Smokeless tobacco: Never Used  . Alcohol Use: No    Allergies: No Known  Allergies  Prescriptions prior to admission  Medication Sig Dispense Refill  . acetaminophen (TYLENOL) 325 MG tablet Take 650 mg by mouth every 6 (six) hours as needed. Takes for headache      . norethindrone (MICRONOR,CAMILA,ERRIN) 0.35 MG tablet Take 1 tablet by mouth daily.      . Prenatal Vit-Fe Fumarate-FA (PRENATAL MULTIVITAMIN) TABS Take 1 tablet by mouth daily.  30 tablet  12  . Prenatal Vit-Fe Fumarate-FA (PRENATAL MULTIVITAMIN) TABS Take 1 tablet by mouth every morning.        Review of Systems  Constitutional: Positive for fever, chills and malaise/fatigue. Negative for weight loss and diaphoresis.  Respiratory:       Breast sore on right  Cardiovascular: Negative for chest pain.  Gastrointestinal: Negative for nausea, vomiting and abdominal pain.  Neurological: Negative for headaches.   Physical Exam   Blood pressure 141/91, pulse 91, temperature 98.1 F (36.7 C), temperature source Oral, resp. rate 18, height 5\' 9"  (1.753 m), weight 225 lb (102.059 kg), currently breastfeeding.  Physical Exam  Constitutional: She is oriented to person, place, and time. She appears well-developed and well-nourished. No distress.  HENT:  Head: Normocephalic.  Cardiovascular: Normal rate.   Respiratory: Effort normal.  Right breast full, no erethema Slightly engorged Expressed white milk from nipple, no blood Left breast normal.   GI: Soft. There is no tenderness.  Musculoskeletal: Normal range of motion.  Neurological: She is alert and oriented to person, place, and time.  Skin: Skin is warm and dry.  Psychiatric: She has a normal mood and affect.    MAU Course  Procedures  Assessment and Plan  A:  Right mastitis      Blood in milk, probably related to nipple trauma  P:  Discussed with Dr Ambrose Mantle      Will treat as mastitis, rxed Keflex and Motrin, first doses given here      Reviewed slow weaning, turn down suction on pump      Call office in am to schedule breast US  at [facility] 10/30/2012, 1:40 AM

## 2012-10-30 NOTE — MAU Note (Signed)
Pt post vag delivery 05/22/2012, breastfeeding.  Has been feeling achy and tired since 10/24/2012, sore breast, right side hurts worse.  Tonight pumped bloody milk with clots.  Pt took antibiotics about 1 month ago for possible yeast infection on the breast.

## 2012-11-02 ENCOUNTER — Ambulatory Visit
Admission: RE | Admit: 2012-11-02 | Discharge: 2012-11-02 | Disposition: A | Discharge status: Home / Self Care | Payer: 59 | Source: Ambulatory Visit | Attending: Obstetrics and Gynecology | Admitting: Obstetrics and Gynecology

## 2012-11-02 DIAGNOSIS — N6452 Nipple discharge: Secondary | ICD-10-CM

## 2012-11-29 ENCOUNTER — Encounter (HOSPITAL_COMMUNITY)
Admission: RE | Admit: 2012-11-29 | Discharge: 2012-11-29 | Disposition: A | Discharge status: Home / Self Care | Payer: 59 | Source: Ambulatory Visit | Attending: Obstetrics and Gynecology | Admitting: Obstetrics and Gynecology

## 2012-11-29 DIAGNOSIS — O923 Agalactia: Secondary | ICD-10-CM | POA: Insufficient documentation

## 2012-12-28 ENCOUNTER — Encounter (HOSPITAL_COMMUNITY)
Admission: RE | Admit: 2012-12-28 | Discharge: 2012-12-28 | Disposition: A | Discharge status: Home / Self Care | Payer: 59 | Source: Ambulatory Visit | Attending: Obstetrics and Gynecology | Admitting: Obstetrics and Gynecology

## 2012-12-28 DIAGNOSIS — O923 Agalactia: Secondary | ICD-10-CM | POA: Insufficient documentation

## 2014-04-28 ENCOUNTER — Emergency Department (HOSPITAL_BASED_OUTPATIENT_CLINIC_OR_DEPARTMENT_OTHER)
Admission: EM | Admit: 2014-04-28 | Discharge: 2014-04-28 | Disposition: A | Discharge status: Home / Self Care | Payer: 59 | Attending: Emergency Medicine | Admitting: Emergency Medicine

## 2014-04-28 DIAGNOSIS — Z79899 Other long term (current) drug therapy: Secondary | ICD-10-CM | POA: Insufficient documentation

## 2014-04-28 DIAGNOSIS — Z792 Long term (current) use of antibiotics: Secondary | ICD-10-CM | POA: Insufficient documentation

## 2014-04-28 DIAGNOSIS — Z8719 Personal history of other diseases of the digestive system: Secondary | ICD-10-CM | POA: Insufficient documentation

## 2014-04-28 DIAGNOSIS — Z8768 Personal history of other (corrected) conditions arising in the perinatal period: Secondary | ICD-10-CM | POA: Insufficient documentation

## 2014-04-28 DIAGNOSIS — Z8632 Personal history of gestational diabetes: Secondary | ICD-10-CM | POA: Insufficient documentation

## 2014-04-28 DIAGNOSIS — I10 Essential (primary) hypertension: Secondary | ICD-10-CM | POA: Insufficient documentation

## 2014-04-28 DIAGNOSIS — Z87898 Personal history of other specified conditions: Secondary | ICD-10-CM | POA: Insufficient documentation

## 2014-04-28 LAB — CBC WITH DIFFERENTIAL/PLATELET
BASOS ABS: 0 10*3/uL (ref 0.0–0.1)
BASOS PCT: 1 % (ref 0–1)
EOS ABS: 0.2 10*3/uL (ref 0.0–0.7)
Eosinophils Relative: 4 % (ref 0–5)
HCT: 38.2 % (ref 36.0–46.0)
Hemoglobin: 12.8 g/dL (ref 12.0–15.0)
Lymphocytes Relative: 55 % — ABNORMAL HIGH (ref 12–46)
Lymphs Abs: 3.5 10*3/uL (ref 0.7–4.0)
MCH: 28.2 pg (ref 26.0–34.0)
MCHC: 33.5 g/dL (ref 30.0–36.0)
MCV: 84.1 fL (ref 78.0–100.0)
Monocytes Absolute: 0.4 10*3/uL (ref 0.1–1.0)
Monocytes Relative: 7 % (ref 3–12)
NEUTROS PCT: 35 % — AB (ref 43–77)
Neutro Abs: 2.2 10*3/uL (ref 1.7–7.7)
PLATELETS: 210 10*3/uL (ref 150–400)
RBC: 4.54 MIL/uL (ref 3.87–5.11)
RDW: 13.6 % (ref 11.5–15.5)
WBC: 6.3 10*3/uL (ref 4.0–10.5)

## 2014-04-28 LAB — BASIC METABOLIC PANEL
ANION GAP: 11 (ref 5–15)
BUN: 9 mg/dL (ref 6–23)
CO2: 24 mEq/L (ref 19–32)
Calcium: 9.5 mg/dL (ref 8.4–10.5)
Chloride: 106 mEq/L (ref 96–112)
Creatinine, Ser: 0.7 mg/dL (ref 0.50–1.10)
Glucose, Bld: 93 mg/dL (ref 70–99)
POTASSIUM: 4.2 meq/L (ref 3.7–5.3)
SODIUM: 141 meq/L (ref 137–147)

## 2014-04-28 MED ORDER — HYDROCHLOROTHIAZIDE 25 MG PO TABS: 25.0000 mg | ORAL_TABLET | Freq: Every day | ORAL | Status: DC

## 2014-04-28 NOTE — ED Provider Notes (Signed)
Complains of left-sided anterior chest pressure intermittently onset 4 days ago. Pain lasts for minutes at a time, nonexertional accompanied by palpitations no shortness of breath no sweatiness no nausea. Presently asymptomatic. On exam alert nontoxic lungs clear auscultation heart regular rate and rhythm abdomen obese, nontender. Patient has appointment with [facility] scheduled for 05/12/2014. Doubt acute coronary syndrome in this young man straining female with no cardiac risk factors, normal EKG  Doug SouSam Aseem Sessums, MD 04/28/14 2012

## 2014-04-28 NOTE — ED Provider Notes (Signed)
CSN: [id number]     Arrival date & time 04/28/14  1609 History   First MD Initiated Contact with Patient 04/28/14 1924     Chief Complaint  Patient presents with  . Chest Pain     (Consider location/radiation/quality/duration/timing/severity/associated sxs/prior Treatment) Patient is a 33 y.o. female presenting with hypertension. The history is provided by the patient. No language interpreter was used.  Hypertension This is a recurrent problem. The current episode started today. The problem occurs constantly. The problem has been unchanged. Pertinent negatives include no abdominal pain, neck pain or vomiting. Nothing aggravates the symptoms. She has tried nothing for the symptoms. The treatment provided no relief.    Past Medical History  Diagnosis Date  . Dysrhythmia   . Gestational diabetes     diet controlled  . Diverticulitis   . Hx of Bell's palsy     as newborn  . SVD (spontaneous vaginal delivery) 05/24/2012   Past Surgical History  Procedure Laterality Date  . Breast cyst excision    . Wisdom tooth extraction     Family History  Problem Relation Age of Onset  . Anesthesia problems Neg Hx   . Diabetes Mother   . Heart disease Maternal Aunt   . Diabetes Maternal Aunt   . Diabetes Maternal Uncle   . Cancer Maternal Uncle     bone  . Diabetes Maternal Grandmother   . Hypertension Maternal Grandfather   . Heart disease Maternal Grandfather   . Diabetes Maternal Grandfather    History  Substance Use Topics  . Smoking status: Never Smoker   . Smokeless tobacco: Never Used  . Alcohol Use: No   OB History   Grav Para Term Preterm Abortions TAB SAB Ect Mult Living   7 5 5  2  2   5      Review of Systems  Gastrointestinal: Negative for vomiting and abdominal pain.  Musculoskeletal: Negative for neck pain.  All other systems reviewed and are negative.     Allergies  Review of patient's allergies indicates no known allergies.  Home Medications   Prior to  Admission medications   Medication Sig Start Date End Date Taking? Authorizing Provider  acetaminophen (TYLENOL) 325 MG tablet Take 650 mg by mouth every 6 (six) hours as needed. Takes for headache    Historical Provider, MD  cephALEXin (KEFLEX) 500 MG capsule Take 1 capsule (500 mg total) by mouth 4 (four) times daily. 10/30/12   Aviva SignsMarie L Williams, CNM  ibuprofen (ADVIL,MOTRIN) 600 MG tablet Take 1 tablet (600 mg total) by mouth every 6 (six) hours as needed for pain. 10/30/12   Aviva SignsMarie L Williams, CNM  norethindrone (MICRONOR,CAMILA,ERRIN) 0.35 MG tablet Take 1 tablet by mouth daily.    Historical Provider, MD  Prenatal Vit-Fe Fumarate-FA (PRENATAL MULTIVITAMIN) TABS Take 1 tablet by mouth every morning.    Historical Provider, MD  Prenatal Vit-Fe Fumarate-FA (PRENATAL MULTIVITAMIN) TABS Take 1 tablet by mouth daily. 05/24/12   Jody Bovard-Stuckert, MD   BP 144/94  Pulse 94  Temp(Src) 98.6 F (37 C) (Oral)  Resp 18  Ht 5\' 9"  (1.753 m)  Wt 235 lb (106.595 kg)  BMI 34.69 kg/m2  SpO2 100%  LMP 04/28/2014  Breastfeeding? No Physical Exam  Nursing note and vitals reviewed. Constitutional: She is oriented to person, place, and time. She appears well-developed and well-nourished.  HENT:  Head: Normocephalic.  Right Ear: External ear normal.  Nose: Nose normal.  Mouth/Throat: Oropharynx is clear and moist.  Eyes: Conjunctivae and EOM are normal. Pupils are equal, round, and reactive to light.  Neck: Normal range of motion.  Cardiovascular: Normal rate and normal heart sounds.   Pulmonary/Chest: Effort normal.  Abdominal: Soft. She exhibits no distension.  Musculoskeletal: Normal range of motion.  Neurological: She is alert and oriented to person, place, and time.  Psychiatric: She has a normal mood and affect.    ED Course  Procedures (including critical care time) Labs Review Labs Reviewed - No data to display  Imaging Review No results found.   EKG Interpretation   Date/Time:   Monday April 28 2014 16:18:33 EDT Ventricular Rate:  95 PR Interval:  136 QRS Duration: 84 QT Interval:  354 QTC Calculation: 444 R Axis:   55 Text Interpretation:  Normal sinus rhythm Normal ECG No significant change  since last tracing Confirmed by Ethelda ChickJACUBOWITZ  MD, SAM [phone]) on 04/28/2014  7:56:21 PM      MDM   Final diagnoses:  Essential hypertension   Hctz Pt advised to see her MD for recheck   Elson AreasLeslie K Ricky Doan, PA-C 04/28/14 2124

## 2014-04-28 NOTE — ED Notes (Signed)
Pt. Reports she is over tired and has children and has a lot to do and has been having chest pain and weakness since Thurs.  Pt. Reports she was noted with tachycardia on Sat.  Pt. Reports she feels like her hear begins to race and then slow down.

## 2014-04-28 NOTE — ED Notes (Signed)
Pt. Was on B/P meds and was taken off of them.

## 2014-04-28 NOTE — Discharge Instructions (Signed)
Hypertension Hypertension, commonly called high blood pressure, is when the force of blood pumping through your arteries is too strong. Your arteries are the blood vessels that carry blood from your heart throughout your body. A blood pressure reading consists of a higher number over a lower number, such as 110/72. The higher number (systolic) is the pressure inside your arteries when your heart pumps. The lower number (diastolic) is the pressure inside your arteries when your heart relaxes. Ideally you want your blood pressure below 120/80. Hypertension forces your heart to work harder to pump blood. Your arteries may become narrow or stiff. Having hypertension puts you at risk for heart disease, stroke, and other problems.  RISK FACTORS Some risk factors for high blood pressure are controllable. Others are not.  Risk factors you cannot control include:   Race. You may be at higher risk if you are African American.  Age. Risk increases with age.  Gender. Men are at higher risk than women before age 45 years. After age 65, women are at higher risk than men. Risk factors you can control include:  Not getting enough exercise or physical activity.  Being overweight.  Getting too much fat, sugar, calories, or salt in your diet.  Drinking too much alcohol. SIGNS AND SYMPTOMS Hypertension does not usually cause signs or symptoms. Extremely high blood pressure (hypertensive crisis) may cause headache, anxiety, shortness of breath, and nosebleed. DIAGNOSIS  To check if you have hypertension, your health care provider will measure your blood pressure while you are seated, with your arm held at the level of your heart. It should be measured at least twice using the same arm. Certain conditions can cause a difference in blood pressure between your right and left arms. A blood pressure reading that is higher than normal on one occasion does not mean that you need treatment. If one blood pressure reading  is high, ask your health care provider about having it checked again. TREATMENT  Treating high blood pressure includes making lifestyle changes and possibly taking medication. Living a healthy lifestyle can help lower high blood pressure. You may need to change some of your habits. Lifestyle changes may include:  Following the DASH diet. This diet is high in fruits, vegetables, and whole grains. It is low in salt, red meat, and added sugars.  Getting at least 2 1/2 hours of brisk physical activity every week.  Losing weight if necessary.  Not smoking.  Limiting alcoholic beverages.  Learning ways to reduce stress. If lifestyle changes are not enough to get your blood pressure under control, your health care provider may prescribe medicine. You may need to take more than one. Work closely with your health care provider to understand the risks and benefits. HOME CARE INSTRUCTIONS  Have your blood pressure rechecked as directed by your health care provider.   Only take medicine as directed by your health care provider. Follow the directions carefully. Blood pressure medicines must be taken as prescribed. The medicine does not work as well when you skip doses. Skipping doses also puts you at risk for problems.   Do not smoke.   Monitor your blood pressure at home as directed by your health care provider. SEEK MEDICAL CARE IF:   You think you are having a reaction to medicines taken.  You have recurrent headaches or feel dizzy.  You have swelling in your ankles.  You have trouble with your vision. SEEK IMMEDIATE MEDICAL CARE IF:  You develop a severe headache or   confusion.  You have unusual weakness, numbness, or feel faint.  You have severe chest or abdominal pain.  You vomit repeatedly.  You have trouble breathing. MAKE SURE YOU:   Understand these instructions.  Will watch your condition.  Will get help right away if you are not doing well or get  worse. Document Released: 10/10/2005 Document Revised: 10/15/2013 Document Reviewed: 08/02/2013 ExitCare Patient Information 2015 [employer]. This information is not intended to replace advice given to you by your health care provider. Make sure you discuss any questions you have with your health care provider.  

## 2014-04-29 NOTE — ED Provider Notes (Signed)
Medical screening examination/treatment/procedure(s) were conducted as a shared visit with non-physician practitioner(s) and myself.  I personally evaluated the patient during the encounter.   EKG Interpretation   Date/Time:  Monday April 28 2014 16:18:33 EDT Ventricular Rate:  95 PR Interval:  136 QRS Duration: 84 QT Interval:  354 QTC Calculation: 444 R Axis:   55 Text Interpretation:  Normal sinus rhythm Normal ECG No significant change  since last tracing Confirmed by Ethelda ChickJACUBOWITZ  MD, Winry Egnew [phone]([id number]) on 04/28/2014  7:56:21 PM       Doug SouSam Emoni Whitworth, MD 04/29/14 0000

## 2014-05-01 ENCOUNTER — Ambulatory Visit (INDEPENDENT_AMBULATORY_CARE_PROVIDER_SITE_OTHER): Payer: 59 | Admitting: Internal Medicine

## 2014-05-01 ENCOUNTER — Encounter (HOSPITAL_COMMUNITY): Payer: Self-pay | Admitting: *Deleted

## 2014-05-01 ENCOUNTER — Encounter: Payer: Self-pay | Admitting: Internal Medicine

## 2014-05-01 VITALS — BP 140/72 | HR 93 | Ht 69.0 in | Wt 237.2 lb

## 2014-05-01 DIAGNOSIS — Z79899 Other long term (current) drug therapy: Secondary | ICD-10-CM

## 2014-05-01 DIAGNOSIS — I10 Essential (primary) hypertension: Secondary | ICD-10-CM

## 2014-05-01 DIAGNOSIS — R0602 Shortness of breath: Secondary | ICD-10-CM

## 2014-05-01 DIAGNOSIS — R002 Palpitations: Secondary | ICD-10-CM

## 2014-05-01 DIAGNOSIS — R0609 Other forms of dyspnea: Secondary | ICD-10-CM | POA: Insufficient documentation

## 2014-05-01 DIAGNOSIS — R0989 Other specified symptoms and signs involving the circulatory and respiratory systems: Secondary | ICD-10-CM

## 2014-05-01 DIAGNOSIS — R079 Chest pain, unspecified: Secondary | ICD-10-CM

## 2014-05-01 DIAGNOSIS — R011 Cardiac murmur, unspecified: Secondary | ICD-10-CM

## 2014-05-01 LAB — BASIC METABOLIC PANEL
BUN: 10 mg/dL (ref 6–23)
CALCIUM: 9.6 mg/dL (ref 8.4–10.5)
CO2: 29 mEq/L (ref 19–32)
Chloride: 102 mEq/L (ref 96–112)
Creat: 0.84 mg/dL (ref 0.50–1.10)
GLUCOSE: 145 mg/dL — AB (ref 70–99)
Potassium: 3.9 mEq/L (ref 3.5–5.3)
SODIUM: 138 meq/L (ref 135–145)

## 2014-05-01 LAB — MAGNESIUM: MAGNESIUM: 1.9 mg/dL (ref 1.5–2.5)

## 2014-05-01 MED ORDER — VALSARTAN 80 MG PO TABS: 80.0000 mg | ORAL_TABLET | Freq: Every day | ORAL | Status: DC

## 2014-05-01 NOTE — Progress Notes (Signed)
OFFICE NOTE  Chief Complaint:  Chest pain, murmur, DOE, palpitations  Primary Care Physician: Agustina CaroliHICKS, KRISTIN D, MD  HPI:  [name] Guerra is a pleasant 33 year old female who was previously seen by Dr. Herbie BaltimoreHarding in our office in 2013. She was referred during her 31st week of pregnancy, which incidentally was her fifth pregnancy, for evaluation of palpitations. She probably had an echocardiogram in the past that showed mild concentric LVH and a small PFO with a small left to right shunt. The right atrium was apparently normal in size. She underwent monitoring which demonstrated periods of sinus tachycardia but no arrhythmias or any significant extrasystoles. No additional workup was recommended at that time. She now presents for evaluation of palpitations. Her primary care physician apparently told her that she had an extra beat on the right side of her heart, that her heart was racing and that she may need surgery. She reports fatigue, nausea, weakness, dyspnea on exertion, chest pain, lightheadedness, dizziness, leg swelling, and palpitations.  Most of these symptoms it started over the past several months. 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  With regards to chest pain she describes it as sharp and very short in duration. She is more bothered by shortness of breath and ongoing fatigue. She reports fairly good sleep at night and her husband reports that she does snore occasionally but generally is noted to sleep well without significant apneic events.  Recently she was started on medication for hypertension which included minoxidil. She was subsequently seen in the emergency room and not felt to have a cardiac chest pain cause. Hydrochlorothiazide was added and it seems to have only worsened her symptoms.  PMHx:  Past Medical History  Diagnosis Date  . Dysrhythmia   . Gestational diabetes     diet  controlled  . Diverticulitis   . Hx of Bell's palsy     as newborn  . SVD (spontaneous vaginal delivery) 05/24/2012    Past Surgical History  Procedure Laterality Date  . Breast cyst excision    . Wisdom tooth extraction      FAMHx:  Family History  Problem Relation Age of Onset  . Anesthesia problems Neg Hx   . Diabetes Mother   . Heart disease Maternal Aunt   . Diabetes Maternal Aunt   . Diabetes Maternal Uncle   . Cancer Maternal Uncle     bone  . Diabetes Maternal Grandmother   . Hypertension Maternal Grandfather   . Heart disease Maternal Grandfather   . Diabetes Maternal Grandfather     SOCHx:   reports that she has never smoked. She has never used smokeless tobacco. She reports that she does not drink alcohol or use illicit drugs.  ALLERGIES:  No Known Allergies  ROS: A comprehensive review of systems was negative except for: Constitutional: positive for fatigue Respiratory: positive for dyspnea on exertion Cardiovascular: positive for chest pain, lower extremity edema and palpitations Behavioral/Psych: positive for anxiety  HOME MEDS: Current Outpatient Prescriptions  Medication Sig Dispense Refill  . acetaminophen (TYLENOL) 325 MG tablet Take 650 mg by mouth every 6 (six) hours as needed. Takes for headache      . ibuprofen (ADVIL,MOTRIN) 600 MG tablet Take 1 tablet (600 mg total) by mouth every 6 (six) hours as needed for pain.  30 tablet  0  . valsartan (DIOVAN) 80 MG  tablet Take 1 tablet (80 mg total) by mouth daily.  30 tablet  6   No current facility-administered medications for this visit.    LABS/IMAGING: No results found for this or any previous visit (from the past 48 hour(s)). No results found.  VITALS: BP 140/72  Pulse 93  Ht 5\' 9"  (1.753 m)  Wt 237 lb 3.2 oz (107.593 kg)  BMI 35.01 kg/m2  LMP 04/28/2014  EXAM: General appearance: alert and no distress Neck: no carotid bruit and no JVD Lungs: clear to auscultation bilaterally Heart:  occasionally irregular, s1/s2, 2/6 SEM at apex Abdomen: soft, non-tender; bowel sounds normal; no masses,  no organomegaly Extremities: edema trace LLE edema Pulses: 2+ and symmetric Skin: Skin color, texture, turgor normal. No rashes or lesions Neurologic: Grossly normal Psych: Mildly anxious  EKG: Normal sinus rhythm at 93  ASSESSMENT: 1. Fatigue, palpitations, dyspnea on exertion 2. Murmur 3. Atypical chest pain  PLAN: 1.   [name] has a number of cardiac symptoms and what appears to be significant anxiety about palpitations. She does report some missed beats and I was able to auscultate a missed beat today on exam. There is a soft murmur and I would recommend an echocardiogram, especially based on her history of small PFO in the past. I would recommend a bubble study as well. In addition, would recommend a Bruce treadmill stress test to evaluate for any possible ischemic cause of her chest pain. I do believe her medications are causing a lot of her problems. Minoxidil is not considered a first line drug for treatment of hypertension. I would recommend discontinuing this in addition to stopping her hydrochlorothiazide which may be causing excess diuresis and/or electrolyte abnormalities.  I will go and check a metabolic profile, magnesium and BNP today. Plan to switch her over to Diovan 80 mg daily for blood pressure control.  I will see her back in about a month to review her studies. Thank you again for the kind referral.  Chrystie Nose, MD, [facility] Attending Cardiologist [facility]  HILTY,Kenneth C 05/01/2014, 3:45 PM

## 2014-05-01 NOTE — Patient Instructions (Addendum)
Your physician has requested that you have an echocardiogram. Echocardiography is a painless test that uses sound waves to create images of your heart. It provides your doctor with information about the size and shape of your heart and how well your heart's chambers and valves are working. This procedure takes approximately one hour. There are no restrictions for this procedure. ** with bubble study  Your physician has requested that you have an exercise tolerance test. For further information please visit [email or web address]. Please also follow instruction sheet, as given.  Your physician has recommended you make the following change in your medication...  1. STOP hydrochlorothiazide and minoxidil.  2. START valsartan 80mg  once daily (for blood pressure)  Please have lab work TODAY.   Your physician recommends that you schedule a follow-up appointment in: 1 month with Dr. Rennis GoldenHilty

## 2014-05-02 LAB — BRAIN NATRIURETIC PEPTIDE

## 2014-05-07 ENCOUNTER — Telehealth (HOSPITAL_COMMUNITY): Payer: Self-pay

## 2014-05-07 ENCOUNTER — Encounter: Payer: Self-pay | Admitting: *Deleted

## 2014-05-07 NOTE — Telephone Encounter (Signed)
Encounter complete. 

## 2014-05-08 ENCOUNTER — Telehealth (HOSPITAL_COMMUNITY): Payer: Self-pay

## 2014-05-08 NOTE — Telephone Encounter (Signed)
Encounter complete. 

## 2014-05-09 ENCOUNTER — Ambulatory Visit (HOSPITAL_COMMUNITY)
Admission: RE | Admit: 2014-05-09 | Discharge: 2014-05-09 | Disposition: A | Discharge status: Home / Self Care | Payer: 59 | Source: Ambulatory Visit | Attending: Cardiology | Admitting: Cardiology

## 2014-05-09 ENCOUNTER — Ambulatory Visit (HOSPITAL_BASED_OUTPATIENT_CLINIC_OR_DEPARTMENT_OTHER)
Admission: RE | Admit: 2014-05-09 | Discharge: 2014-05-09 | Disposition: A | Discharge status: Home / Self Care | Payer: 59 | Source: Ambulatory Visit | Attending: Cardiology | Admitting: Cardiology

## 2014-05-09 DIAGNOSIS — R011 Cardiac murmur, unspecified: Secondary | ICD-10-CM

## 2014-05-09 DIAGNOSIS — I379 Nonrheumatic pulmonary valve disorder, unspecified: Secondary | ICD-10-CM

## 2014-05-09 DIAGNOSIS — R079 Chest pain, unspecified: Secondary | ICD-10-CM | POA: Insufficient documentation

## 2014-05-09 DIAGNOSIS — R0609 Other forms of dyspnea: Secondary | ICD-10-CM

## 2014-05-09 DIAGNOSIS — R0989 Other specified symptoms and signs involving the circulatory and respiratory systems: Secondary | ICD-10-CM

## 2014-05-09 NOTE — Procedures (Signed)
Exercise Treadmill Test   Test  Exercise Tolerance Test Ordering MD: Chrystie NoseKenneth C. Sabrea Sankey    Unique Test No: 1   Treadmill:  1  Indication for ETT: chest pain - rule out ischemia  Contraindication to ETT: No   Stress Modality: exercise - treadmill  Cardiac Imaging Performed: non   Protocol: standard Bruce - maximal  Max BP:  163/72  Max MPHR (bpm):  187 85% MPR (bpm):  158  MPHR obtained (bpm):  190 % MPHR obtained:  101  Reached 85% MPHR (min:sec):  3:30 Total Exercise Time (min-sec):  6:39  Workload in METS:  8 Borg Scale: 16  Reason ETT Terminated:  dyspnea Pt. Stated she felt like she might pass out, borderline hyperventilating   ST Segment Analysis At Rest: normal ST segments - no evidence of significant ST depression With Exercise: non-specific upsloping ST segments, PVC's during recovery  Other Information Arrhythmia:  Yes Angina during ETT:  absent (0) Quality of ETT:  diagnostic  ETT Interpretation:  normal - no evidence of ischemia by ST analysis  Comments: No ischemic EKG changes noted with exercise Fair exercise effort Normal BP response to exercise PVC's noted during recovery Steep HR response to exercise  Recommendations: B-Blocker may be indicated  Chrystie NoseKenneth C. Burech Mcfarland, MD, [facility] Attending Cardiologist [facility]

## 2014-05-09 NOTE — Progress Notes (Signed)
2D Echocardiogram Complete.  05/09/2014   Aurea Aronov, RDCS  

## 2014-05-12 ENCOUNTER — Ambulatory Visit: Payer: 59 | Admitting: Cardiology

## 2014-05-16 ENCOUNTER — Telehealth: Payer: Self-pay | Admitting: Internal Medicine

## 2014-05-16 NOTE — Telephone Encounter (Signed)
[name] Guerra was calling about her echo results and has some questions .Marland Kitchen. Please call   Thanks

## 2014-05-16 NOTE — Telephone Encounter (Signed)
PATIENT WANTED TO KNOW IF SHE NEED ANY MEDICATION AT PRESENT TIME. RN REVIEWED REPORT. Dr Rennis GoldenHilty did not indicated at the present time ,will discuss at follow up She verbalized understanding.

## 2014-06-13 ENCOUNTER — Encounter: Payer: Self-pay | Admitting: Internal Medicine

## 2014-06-13 ENCOUNTER — Ambulatory Visit (INDEPENDENT_AMBULATORY_CARE_PROVIDER_SITE_OTHER): Payer: 59 | Admitting: Internal Medicine

## 2014-06-13 VITALS — BP 118/90 | HR 84 | Ht 69.0 in | Wt 237.0 lb

## 2014-06-13 DIAGNOSIS — R002 Palpitations: Secondary | ICD-10-CM

## 2014-06-13 DIAGNOSIS — I10 Essential (primary) hypertension: Secondary | ICD-10-CM

## 2014-06-13 NOTE — Patient Instructions (Signed)
Your physician wants you to follow-up in: 1 year. You will receive a reminder letter in the mail two months in advance. If you don't receive a letter, please call our office to schedule the follow-up appointment.  

## 2014-06-13 NOTE — Progress Notes (Signed)
OFFICE NOTE  Chief Complaint:  Chest pain, murmur, DOE, palpitations  Primary Care Physician: [name] Caroli, MD  HPI:  [name] Guerra is a pleasant 33 year old female who was previously seen by Dr. Herbie Guerra in our office in 2013. She was referred during her 31st week of pregnancy, which incidentally was her fifth pregnancy, for evaluation of palpitations. She probably had an echocardiogram in the past that showed mild concentric LVH and a small PFO with a small left to right shunt. The right atrium was apparently normal in size. She underwent monitoring which demonstrated periods of sinus tachycardia but no arrhythmias or any significant extrasystoles. No additional workup was recommended at that time. She now presents for evaluation of palpitations. Her primary care physician apparently told her that she had an extra beat on the right side of her heart, that her heart was racing and that she may need surgery. She reports fatigue, nausea, weakness, dyspnea on exertion, chest pain, lightheadedness, dizziness, leg swelling, and palpitations.  Most of these symptoms it started over the past several months. 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  With regards to chest pain she describes it as sharp and very short in duration. She is more bothered by shortness of breath and ongoing fatigue. She reports fairly good sleep at night and her husband reports that she does snore occasionally but generally is noted to sleep well without significant apneic events.  Recently she was started on medication for hypertension which included minoxidil. She was subsequently seen in the emergency room and not felt to have a cardiac chest pain cause. Hydrochlorothiazide was added and it seems to have only worsened her symptoms.  [name] Guerra returns today for followup. She reports her symptoms have improved. Her echo was  essentially unremarkable which showed a hyperdynamic EF likely taper her murmur. She underwent an exercise treadmill stress test which was negative for ischemia. There were some PACs noted in recovery. Since changing her medications her blood pressures been well controlled and she's had no adverse symptoms. I think she's currently on good medications.  PMHx:  Past Medical History  Diagnosis Date  . Dysrhythmia   . Gestational diabetes     diet controlled  . Diverticulitis   . Hx of Bell's palsy     as newborn  . SVD (spontaneous vaginal delivery) 05/24/2012    Past Surgical History  Procedure Laterality Date  . Breast cyst excision    . Wisdom tooth extraction    . Transthoracic echocardiogram  02/09/2012    EF=55%, Patent foramen ovale is present, Doppler suggest left to right interatrial shunt    FAMHx:  Family History  Problem Relation Age of Onset  . Anesthesia problems Neg Hx   . Diabetes Mother   . Heart disease Maternal Aunt   . Diabetes Maternal Aunt   . Diabetes Maternal Uncle   . Cancer Maternal Uncle     bone  . Diabetes Maternal Grandmother   . Hypertension Maternal Grandfather   . Heart disease Maternal Grandfather   . Diabetes Maternal Grandfather     SOCHx:   reports that she has never smoked. She has never used smokeless tobacco. She reports that she does not drink alcohol or use illicit drugs.  ALLERGIES:  No Known Allergies  ROS: A comprehensive review of systems was negative.  HOME MEDS: Current Outpatient  Prescriptions  Medication Sig Dispense Refill  . Norgestimate-Ethinyl Estradiol Triphasic (ORTHO TRI-CYCLEN, 28,) 0.18/0.215/0.25 MG-35 MCG tablet       . valsartan (DIOVAN) 80 MG tablet Take 1 tablet (80 mg total) by mouth daily.  30 tablet  6   No current facility-administered medications for this visit.    LABS/IMAGING: No results found for this or any previous visit (from the past 48 hour(s)). No results found.  VITALS: BP 118/90   Pulse 84  Ht 5\' 9"  (1.753 m)  Wt 237 lb (107.502 kg)  BMI 34.98 kg/m2  EXAM: deferred  EKG: deferred  ASSESSMENT: 1. Fatigue, palpitations, dyspnea on exertion - improved 2. Murmur - likely flow related, normal echo 3. Atypical chest pain- negative GXT  PLAN: 1.   [name] Guerra had a negative exercise stress test. Her echocardiogram was essentially normal with no significant valvular disease. Her blood pressure has improved and seems to be tolerating her current medication. I would recommend seeing her back in one year or sooner as necessary.  [name] Guerra. [name] Giraldo, MD, [facility] Attending Cardiologist [facility]  [name] Guerra 06/13/2014, 10:46 AM

## 2014-07-02 ENCOUNTER — Ambulatory Visit: Payer: 59 | Admitting: Cardiology

## 2014-08-08 ENCOUNTER — Telehealth: Payer: Self-pay | Admitting: Internal Medicine

## 2014-08-08 ENCOUNTER — Encounter: Payer: Self-pay | Admitting: *Deleted

## 2014-08-08 MED ORDER — METOPROLOL TARTRATE 25 MG PO TABS: 25.0000 mg | ORAL_TABLET | Freq: Two times a day (BID) | ORAL | Status: DC

## 2014-08-08 NOTE — Telephone Encounter (Signed)
Returned call to patient no answer.LMTC. 

## 2014-08-08 NOTE — Telephone Encounter (Signed)
LMTCB

## 2014-08-08 NOTE — Telephone Encounter (Signed)
Spoke with Dr. Antoine PocheHochrein >> start patient on metoprolol tartrate 25mg  BID.  Rx was sent to pharmacy electronically.    Left VM for patient to return call

## 2014-08-08 NOTE — Telephone Encounter (Signed)
Returning your call. °

## 2014-08-08 NOTE — Telephone Encounter (Signed)
[name] Guerra is calling because she is starting to feel tha extra heartbeat again and wanted to speak to someone about it. Please call   Thanks

## 2014-08-08 NOTE — Telephone Encounter (Signed)
Spoke with patient and provided recommendations. 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 She wishes to try to take things easier next week and take some days off. She does not wish to add any medications at this time. She will contact office should her symptoms persist or worsen and if she decide medication is the next best step for her.

## 2014-08-08 NOTE — Telephone Encounter (Signed)
Patient returned call. She has been feeling fine since last OV with Dr. Rennis GoldenHilty in August. She had her 5th baby. She has gone back to work and had been working 2 jobs (1 at [employer] and 2nd at [employer]). She cut out her 2nd job at [employer] . She reports that on Monday she noticed her palpitations were back and she felt a heaviness in her chest. She states that Monday she felt some dizziness. She feels weak, tired, no energy. She reports SOB. No medication changes have occurred. She states she is not sure if this is related to her working a lot? She is a CNA at [employer] and the nurse noticed that she did not appear to feel well, which prompted her to call.   Dr. Rennis GoldenHilty had ordered a stress test <Stress test was negative for ischemia, PVC's noted - may need b-blocker - per Dr. Rennis GoldenHilty> Dr. Rennis GoldenHilty ordered an echo <Essentially normal echo - trivial pericardial fluid> Both in July.   At follow up on 8/21 the patient reported her symptoms have improved >> her echo was essentially unremarkable which showed a hyperdynamic EF likely taper her murmur, she underwent an exercise treadmill stress test which was negative for ischemia but there were some PACs noted in recovery  Patient reports BP 130/100 HR 105  Will talk with Dr. Antoine PocheHochrein about possible low dose beta-blocker (toprol 12.5mg ) or PRN beta blocker for palpitations.

## 2014-08-08 NOTE — Telephone Encounter (Signed)
Pt was returning Jenna's call

## 2014-08-25 ENCOUNTER — Encounter: Payer: Self-pay | Admitting: Internal Medicine

## 2014-10-24 NOTE — L&D Delivery Note (Signed)
Delivery Note Pt reached complete dilation and pushed well.  At 5:31 PM a healthy female was delivered via  (Presentation: OA ).  APGAR:8 ,9 ; weight pending .   Placenta status delivered spontaneously.  Cord:  with the following complications:none .   Anesthesia: Epidural  Episiotomy:  none Lacerations:  none Suture Repair: none  Est. Blood Loss (mL):  330ml  Mom to postpartum.  Baby to Couplet care / Skin to Skin.  [name] Guerra,[name] Guerra 09/29/2015, 5:54 PM

## 2015-03-12 LAB — OB RESULTS CONSOLE GC/CHLAMYDIA
CHLAMYDIA, DNA PROBE: NEGATIVE
Gonorrhea: NEGATIVE

## 2015-03-12 LAB — OB RESULTS CONSOLE ABO/RH: RH Type: POSITIVE

## 2015-03-12 LAB — OB RESULTS CONSOLE RUBELLA ANTIBODY, IGM: RUBELLA: IMMUNE

## 2015-03-12 LAB — OB RESULTS CONSOLE HEPATITIS B SURFACE ANTIGEN: Hepatitis B Surface Ag: NEGATIVE

## 2015-03-12 LAB — OB RESULTS CONSOLE ANTIBODY SCREEN: ANTIBODY SCREEN: NEGATIVE

## 2015-03-12 LAB — OB RESULTS CONSOLE HIV ANTIBODY (ROUTINE TESTING): HIV: NONREACTIVE

## 2015-03-12 LAB — OB RESULTS CONSOLE RPR: RPR: NONREACTIVE

## 2015-07-29 ENCOUNTER — Encounter: Payer: 59 | Attending: Obstetrics and Gynecology

## 2015-07-29 VITALS — Ht 68.0 in | Wt 258.7 lb

## 2015-07-29 DIAGNOSIS — O24419 Gestational diabetes mellitus in pregnancy, unspecified control: Secondary | ICD-10-CM

## 2015-07-29 DIAGNOSIS — Z713 Dietary counseling and surveillance: Secondary | ICD-10-CM | POA: Insufficient documentation

## 2015-08-04 NOTE — Progress Notes (Signed)
  Patient was seen on 07/29/15 for Gestational Diabetes self-management . The following learning objectives were met by the patient :   States the definition of Gestational Diabetes  States why dietary management is important in controlling blood glucose  Describes the effects of carbohydrates on blood glucose levels  Demonstrates ability to create a balanced meal plan  Demonstrates carbohydrate counting   States when to check blood glucose levels  Demonstrates proper blood glucose monitoring techniques  States the effect of stress and exercise on blood glucose levels  States the importance of limiting caffeine and abstaining from alcohol and smoking  Plan:  Aim for 2 Carb Choices per meal (30 grams) +/- 1 either way for breakfast Aim for 3 Carb Choices per meal (45 grams) +/- 1 either way from lunch and dinner Aim for 1-2 Carbs per snack Begin reading food labels for Total Carbohydrate and sugar grams of foods Consider  increasing your activity level by walking daily as tolerated Begin checking BG before breakfast and 1-2 hours after first bit of breakfast, lunch and dinner after  as directed by MD  Take medication  as directed by MD  Blood glucose monitor given: None, testing per MD  Patient instructed to monitor glucose levels: FBS: 60 - <90 1 hour: <140 2 hour: <120  Patient received the following handouts:  Nutrition Diabetes and Pregnancy  Carbohydrate Counting List  Meal Planning worksheet  Patient will be seen for follow-up as needed.

## 2015-09-01 LAB — OB RESULTS CONSOLE GBS: GBS: NEGATIVE

## 2015-09-24 ENCOUNTER — Encounter (HOSPITAL_COMMUNITY): Payer: Self-pay

## 2015-09-24 ENCOUNTER — Inpatient Hospital Stay (HOSPITAL_COMMUNITY)
Admission: AD | Admit: 2015-09-24 | Discharge: 2015-09-24 | Disposition: A | Discharge status: Home / Self Care | Payer: 59 | Source: Ambulatory Visit | Attending: Obstetrics and Gynecology | Admitting: Obstetrics and Gynecology

## 2015-09-24 DIAGNOSIS — Z3A38 38 weeks gestation of pregnancy: Secondary | ICD-10-CM | POA: Insufficient documentation

## 2015-09-24 DIAGNOSIS — O368390 Maternal care for abnormalities of the fetal heart rate or rhythm, unspecified trimester, not applicable or unspecified: Secondary | ICD-10-CM

## 2015-09-24 DIAGNOSIS — O133 Gestational [pregnancy-induced] hypertension without significant proteinuria, third trimester: Secondary | ICD-10-CM | POA: Diagnosis not present

## 2015-09-24 LAB — CBC
HEMATOCRIT: 38.6 % (ref 36.0–46.0)
Hemoglobin: 12.8 g/dL (ref 12.0–15.0)
MCH: 28.3 pg (ref 26.0–34.0)
MCHC: 33.2 g/dL (ref 30.0–36.0)
MCV: 85.2 fL (ref 78.0–100.0)
PLATELETS: 191 10*3/uL (ref 150–400)
RBC: 4.53 MIL/uL (ref 3.87–5.11)
RDW: 14.2 % (ref 11.5–15.5)
WBC: 7.3 10*3/uL (ref 4.0–10.5)

## 2015-09-24 LAB — URINE MICROSCOPIC-ADD ON

## 2015-09-24 LAB — PROTEIN / CREATININE RATIO, URINE
Creatinine, Urine: 85 mg/dL
Protein Creatinine Ratio: 0.08 mg/mg{Cre} (ref 0.00–0.15)
Total Protein, Urine: 7 mg/dL

## 2015-09-24 LAB — URINALYSIS, ROUTINE W REFLEX MICROSCOPIC
Bilirubin Urine: NEGATIVE
Glucose, UA: NEGATIVE mg/dL
Ketones, ur: NEGATIVE mg/dL
NITRITE: NEGATIVE
Protein, ur: NEGATIVE mg/dL
SPECIFIC GRAVITY, URINE: 1.01 (ref 1.005–1.030)
pH: 6 (ref 5.0–8.0)

## 2015-09-24 LAB — COMPREHENSIVE METABOLIC PANEL
ALBUMIN: 3.1 g/dL — AB (ref 3.5–5.0)
ALT: 19 U/L (ref 14–54)
AST: 20 U/L (ref 15–41)
Alkaline Phosphatase: 123 U/L (ref 38–126)
Anion gap: 7 (ref 5–15)
BUN: 7 mg/dL (ref 6–20)
CHLORIDE: 108 mmol/L (ref 101–111)
CO2: 21 mmol/L — AB (ref 22–32)
Calcium: 9.3 mg/dL (ref 8.9–10.3)
Creatinine, Ser: 0.5 mg/dL (ref 0.44–1.00)
GFR calc Af Amer: >60 mL/min (ref >60)
GFR calc non Af Amer: >60 mL/min (ref >60)
GLUCOSE: 61 mg/dL — AB (ref 65–99)
POTASSIUM: 3.9 mmol/L (ref 3.5–5.1)
Sodium: 136 mmol/L (ref 135–145)
Total Bilirubin: 0.6 mg/dL (ref 0.3–1.2)
Total Protein: 7 g/dL (ref 6.5–8.1)

## 2015-09-24 MED ORDER — LABETALOL HCL 5 MG/ML IV SOLN: 20.0000 mg | INTRAVENOUS | Status: DC | PRN

## 2015-09-24 MED ORDER — LACTATED RINGERS IV BOLUS (SEPSIS): 1000.0000 mL | Freq: Once | INTRAVENOUS | Status: DC

## 2015-09-24 MED ORDER — HYDRALAZINE HCL 20 MG/ML IJ SOLN: 10.0000 mg | Freq: Once | INTRAMUSCULAR | Status: DC | PRN

## 2015-09-24 NOTE — MAU Provider Note (Signed)
Chief Complaint:  Non-stress Test   First Provider Initiated Contact with Patient 09/24/15 1240     HPI: [name] Guerra is a 34 y.o. [id number] at 614w1d who sents to maternity admissions for prolonged monitoring because she was found to have fetal tachycardia on NST sat the office. Has had Gestational HTN since 2.6 weeks per prenatal records and A2GDM on Glyburide. States Dr. Senaida Oresichardson plans to induce her next Tuesday 12/6. Reports mild HA x a few days, that resolved w/ Tylenol. Has retuned this morning and is 4/10.    Location: generalized HA Quality: dull Severity: 4/10 in pain scale Duration: 2-3 days Context: none Timing: waxes wanes Modifying factors: resolved w/ Tylenol Associated signs and symptoms: Neg for fever, chills, vision changes, epigastric pain.   Denies contractions, leakage of fluid or vaginal bleeding. Good fetal movement.   Past Medical History: Past Medical History  Diagnosis Date  . Dysrhythmia   . Gestational diabetes     diet controlled  . Diverticulitis   . Hx of Bell's palsy     as newborn  . SVD (spontaneous vaginal delivery) 05/24/2012    Past obstetric history: OB History  Gravida Para Term Preterm AB SAB TAB Ectopic Multiple Living  8 5 5  2 2    5     # Outcome Date GA Lbr Len/2nd Weight Sex Delivery Anes PTL Lv  8 Current           7 Term 05/22/12 4191w5d 07:41 / 00:08 9 lb 3 oz (4.167 kg) F Vag-Spont EPI    6 SAB 2012 45232w0d         5 Term 2010 7519w0d 04:00 8 lb 2 oz (3.685 kg) F Vag-Spont None  Y  4 Term 2006 3819w0d 06:00 8 lb 3 oz (3.714 kg) F Vag-Spont None  Y  3 SAB 2005 7132w0d         2 Term 2004 9167w0d 09:00 7 lb 6 oz (3.345 kg) F Vag-Spont None  Y  1 Term 2001 8419w0d 36:00 8 lb 6 oz (3.799 kg) F Vag-Spont None  Y      Past Surgical History: Past Surgical History  Procedure Laterality Date  . Breast cyst excision    . Wisdom tooth extraction    . Transthoracic echocardiogram  02/09/2012    EF=55%, Patent foramen ovale is present, Doppler  suggest left to right interatrial shunt     Family History: Family History  Problem Relation Age of Onset  . Anesthesia problems Neg Hx   . Diabetes Mother   . Heart disease Maternal Aunt   . Diabetes Maternal Aunt   . Diabetes Maternal Uncle   . Cancer Maternal Uncle     bone  . Diabetes Maternal Grandmother   . Hypertension Maternal Grandfather   . Heart disease Maternal Grandfather   . Diabetes Maternal Grandfather     Social History: Social History  Substance Use Topics  . Smoking status: Never Smoker   . Smokeless tobacco: Never Used  . Alcohol Use: No    Allergies: No Known Allergies  Meds:  Prescriptions prior to admission  Medication Sig Dispense Refill Last Dose  . acetaminophen (TYLENOL) 500 MG tablet Take 1,000 mg by mouth every 6 (six) hours as needed for mild pain or headache.   09/23/2015 at Unknown time  . glyBURIDE (DIABETA) 1.25 MG tablet Take 1 tablet by mouth 2 (two) times daily with a meal.  0 09/24/2015 at Unknown time  . Prenatal Multivit-Min-Fe-FA (  PRENATAL VITAMINS PO) Take 1 each by mouth daily.   09/24/2015 at Unknown time  . [DISCONTINUED] valsartan (DIOVAN) 80 MG tablet Take 1 tablet (80 mg total) by mouth daily. 30 tablet 6 Taking    I have reviewed patient's Past Medical Hx, Surgical Hx, Family Hx, Social Hx, medications and allergies.   ROS:  Review of Systems  Physical Exam   Patient Vitals for the past 24 hrs:  BP Temp Temp src Pulse Resp SpO2 Height Weight  09/24/15 1417 131/82 mmHg - - - - - - -  09/24/15 1412 135/82 mmHg - - - - - - -  09/24/15 1408 139/87 mmHg - - - - - - -  09/24/15 1347 (!) 162/106 mmHg - - 104 - - - -  09/24/15 1332 (!) 165/103 mmHg - - 103 - - - -  09/24/15 1317 157/98 mmHg - - 105 - - - -  09/24/15 1301 158/93 mmHg - - 111 18 - - -  09/24/15 1247 140/92 mmHg - - 103 - - - -  09/24/15 1232 144/93 mmHg - - 105 18 - - -  09/24/15 1217 160/100 mmHg - - 101 - - - -  09/24/15 1207 (!) 151/101 mmHg - - 102 18 -  - -  09/24/15 1201 - - - 116 - 99 % - -  09/24/15 1200 148/100 mmHg - - 115 18 99 % - -  09/24/15 1158 148/100 mmHg 98 F (36.7 C) Oral 103 18 -  (1.753 m) 262 lb (118.842 kg)   Constitutional: Well-developed, well-nourished, obese female in no acute distress.  Cardiovascular: mild tachycardia Respiratory: normal effort GI: Abd soft, non-tender, gravid appropriate for gestational age.  MS: Extremities nontender, 1+ edema. Neurologic: Alert and oriented x 4.  GU: Deferred    FHT:  Baseline 150 , moderate variability, accelerations present, no decelerations Contractions: rare, mild   Labs: Results for orders placed or performed during the hospital encounter of 09/24/15 (from the past 24 hour(s))  Protein / creatinine ratio, urine     Status: None   Collection Time: 09/24/15 12:36 PM  Result Value Ref Range   Creatinine, Urine 85.00 mg/dL   Total Protein, Urine 7 mg/dL   Protein Creatinine Ratio 0.08 0.00 - 0.15 mg/mg[Cre]  Urinalysis, Routine w reflex microscopic (not at [facility])     Status: Abnormal   Collection Time: 09/24/15 12:36 PM  Result Value Ref Range   Color, Urine YELLOW YELLOW   APPearance CLEAR CLEAR   Specific Gravity, Urine 1.010 1.005 - 1.030   pH 6.0 5.0 - 8.0   Glucose, UA NEGATIVE NEGATIVE mg/dL   Hgb urine dipstick MODERATE (A) NEGATIVE   Bilirubin Urine NEGATIVE NEGATIVE   Ketones, ur NEGATIVE NEGATIVE mg/dL   Protein, ur NEGATIVE NEGATIVE mg/dL   Nitrite NEGATIVE NEGATIVE   Leukocytes, UA TRACE (A) NEGATIVE  Urine microscopic-add on     Status: Abnormal   Collection Time: 09/24/15 12:36 PM  Result Value Ref Range   Squamous Epithelial / LPF 0-5 (A) NONE SEEN   WBC, UA 0-5 0 - 5 WBC/hpf   RBC / HPF 0-5 0 - 5 RBC/hpf   Bacteria, UA RARE (A) NONE SEEN  CBC     Status: None   Collection Time: 09/24/15 12:55 PM  Result Value Ref Range   WBC 7.3 4.0 - 10.5 K/uL   RBC 4.53 3.87 - 5.11 MIL/uL   Hemoglobin 12.8 12.0 - 15.0 g/dL   HCT 38.6  36.0 - 46.0 %    MCV 85.2 78.0 - 100.0 fL   MCH 28.3 26.0 - 34.0 pg   MCHC 33.2 30.0 - 36.0 g/dL   RDW 86.5 78.4 - 69.6 %   Platelets 191 150 - 400 K/uL  Comprehensive metabolic panel     Status: Abnormal   Collection Time: 09/24/15 12:55 PM  Result Value Ref Range   Sodium 136 135 - 145 mmol/L   Potassium 3.9 3.5 - 5.1 mmol/L   Chloride 108 101 - 111 mmol/L   CO2 21 (L) 22 - 32 mmol/L   Glucose, Bld 61 (L) 65 - 99 mg/dL   BUN 7 6 - 20 mg/dL   Creatinine, Ser 2.95 0.44 - 1.00 mg/dL   Calcium 9.3 8.9 - 28.4 mg/dL   Total Protein 7.0 6.5 - 8.1 g/dL   Albumin 3.1 (L) 3.5 - 5.0 g/dL   AST 20 15 - 41 U/L   ALT 19 14 - 54 U/L   Alkaline Phosphatase 123 38 - 126 U/L   Total Bilirubin 0.6 0.3 - 1.2 mg/dL   GFR calc non Af Amer >60 >60 mL/min   GFR calc Af Amer >60 >60 mL/min   Anion gap 7 5 - 15    Imaging:  No results found.  MAU Course: 1347: Dr. Ambrose Mantle notified of reactive NST w/ normal baseline, mild HA, elevated BPs including most recent BP 165/103 and recheck of 162/106. Pt's three-year old had just come in to room and family was arguing. Dr. Ambrose Mantle does not want pt to get IV labetalol at this time, but wants check a few more BPs first. Will have pt's family leave room and inform Dr. Ambrose Mantle of any further severe-range BPs.   1420: BP's 139/87, 135/82, 131/42   MDM: -34 year-old female at 38.1 weeks w/ GHTN w/out evidence of Pre-E. Doubt Pre-E HA due to mild severity and resolution w/ Tylenol.   -No Fetal tachycardia on prolonged monitoring. Reactive tracing.   Assessment: 1. Gestational hypertension without significant proteinuria in third trimester   2. Antepartum fetal tachycardia affecting care of mother     Plan: Discharge home in stable condition per consult with Dr. Ambrose Mantle. Preeclampsia precautions discussed in detail.   Labor precautions and fetal kick counts     Follow-up Information    Call [name], MD.   Specialty:  Obstetrics and Gynecology   Why:  to  discuss when you should be induced or As needed if symptoms worsen   Contact information:   [address] [place] [address] [phone]       Follow up with [facility] [place] [facility]ERN[facility] ADMISSIONS.   Why:  As needed if symptoms worsen   Contact information:   [address] [id number] mc [place] [address] [phone]        Medication List    TAKE these medications        acetaminophen 500 MG tablet  Commonly known as:  TYLENOL  Take 1,000 mg by mouth every 6 (six) hours as needed for mild pain or headache.     glyBURIDE 1.25 MG tablet  Commonly known as:  DIABETA  Take 1 tablet by mouth 2 (two) times daily with a meal.     PRENATAL VITAMINS PO  Take 1 each by mouth daily.        [place], CNM 09/24/2015 2:23 PM

## 2015-09-24 NOTE — Discharge Instructions (Signed)

## 2015-09-24 NOTE — MAU Note (Signed)
Pt at Dr. Ebony HailHenley's office and had an NST with fetal tachycardia. Pt here for prolonged monitoring.

## 2015-09-25 ENCOUNTER — Telehealth (HOSPITAL_COMMUNITY): Payer: Self-pay | Admitting: *Deleted

## 2015-09-25 NOTE — Telephone Encounter (Signed)
Preadmission screen  

## 2015-09-26 LAB — CULTURE, OB URINE: SPECIAL REQUESTS: NORMAL

## 2015-09-28 NOTE — H&P (Signed)
[name] Guerra is a 34 y.o. female [id number] at 67 6/7 weeks (EDD 10/07/15 by LMP c/Guerra 9 week [place]) presenting for IOL for pregnancy induced hypertension that began about 2 weeks ago.  At about 37 weeks, the patient presented for her appointment and had BP 140/100 with no proteinuria and normal labs.  She has been followed closely and BP persistently elevated to 140/90's with no preeclamptic features.  Original plan was IOL on 12/13, but since BP rising we have moved it up to today.  Prenatal care also significant for GDM.  Pt had a one hour GTT of 214 and a h/o macrosomia last pregnancy, so we elected to begin BS monitoring with FS.  She has required glyburide 1.25mg  po BID to control her BS, but on that regimen has been good.  She is considering sterilization postparatum.  She had a history of LGA infant last pregnancy but [place] 09/04/15 EFW 55%ile and AFI 12 vertex, so this baby seems normal in size.  Maternal Medical History:  Contractions: Frequency: irregular.   Perceived severity is mild.    Fetal activity: Perceived fetal activity is normal.    Prenatal complications: PIH.   Prenatal Complications - Diabetes: gestational. Diabetes is managed by oral agent (monotherapy).      OB History    Gravida Para Term Preterm AB TAB SAB Ectopic Multiple Living   NSVD x 5 (largest 9#3oz) SAB x 2  Past Medical History  Diagnosis Date  . Dysrhythmia   . Gestational diabetes     diet controlled  . Diverticulitis   . Hx of Bell's palsy     as newborn  . SVD (spontaneous vaginal delivery) 05/24/2012   Past Surgical History  Procedure Laterality Date  . Breast cyst excision    . Wisdom tooth extraction    . Transthoracic echocardiogram  02/09/2012    EF=55%, Patent foramen ovale is present, Doppler suggest left to right interatrial shunt   Family History: family history includes Cancer in her maternal uncle; Diabetes in her maternal aunt, maternal grandfather, maternal  grandmother, maternal uncle, and mother; Heart disease in her maternal aunt and maternal grandfather; Hypertension in her maternal grandfather. There is no history of Anesthesia problems. Social History:  reports that she has never smoked. She has never used smokeless tobacco. She reports that she does not drink alcohol or use illicit drugs.   Prenatal Transfer Tool  Maternal Diabetes: Yes:  Diabetes Type:  Insulin/Medication controlled Genetic Screening: Normal Maternal Ultrasounds/Referrals: Normal Fetal Ultrasounds or other Referrals:  None Maternal Substance Abuse:  No Significant Maternal Medications:  Meds include: Other:   glyburide Significant Maternal Lab Results:  None Other Comments:  None  Review of Systems  Gastrointestinal: Negative for abdominal pain.  Neurological: Negative for headaches.      Last menstrual period 01/06/2015. Maternal Exam:  Uterine Assessment: Contraction strength is mild.  Contraction frequency is irregular.   Abdomen: Fetal presentation: vertex  Introitus: Normal vulva. Normal vagina.  Pelvis: adequate for delivery.      Physical Exam  Constitutional: She appears well-developed.  Cardiovascular: Normal rate and regular rhythm.   Respiratory: Effort normal and breath sounds normal.  GI: Soft.  Genitourinary: Vagina normal.  Neurological: She is alert. She has normal reflexes.  Psychiatric: She has a normal mood and affect.    Prenatal labs: ABO, Rh: O/Positive/-- (05/19 0000) Antibody: Negative (05/19 0000) Rubella: Immune (05/19 0000)  RPR: Nonreactive (05/19 0000)  HBsAg: Negative (05/19 0000)  HIV: Non-reactive (05/19 0000)  GBS: Negative (11/08 0000)  First trimester screen and AFP negative  Assessment/Plan: Pt for IOL with PIH and no proteinuria or preeclamptic features.  Will check PIH labs again on admission.  GDM well-controlled on glyburide.  D/Guerra pt induction process in detail and she desires to proceed.  Plan pitocin and  AROM when able.  [name] Guerra,[name] Guerra 09/28/2015, 7:54 AM

## 2015-09-29 ENCOUNTER — Encounter (HOSPITAL_COMMUNITY): Payer: Self-pay

## 2015-09-29 ENCOUNTER — Inpatient Hospital Stay (HOSPITAL_COMMUNITY)
Admission: RE | Admit: 2015-09-29 | Discharge: 2015-10-01 | DRG: 775 | Disposition: A | Discharge status: Home / Self Care | Payer: 59 | Source: Ambulatory Visit | Attending: Obstetrics and Gynecology | Admitting: Obstetrics and Gynecology

## 2015-09-29 ENCOUNTER — Inpatient Hospital Stay (HOSPITAL_COMMUNITY): Payer: 59 | Admitting: Anesthesiology

## 2015-09-29 DIAGNOSIS — O134 Gestational [pregnancy-induced] hypertension without significant proteinuria, complicating childbirth: Principal | ICD-10-CM | POA: Diagnosis present

## 2015-09-29 DIAGNOSIS — Z8249 Family history of ischemic heart disease and other diseases of the circulatory system: Secondary | ICD-10-CM

## 2015-09-29 DIAGNOSIS — O24425 Gestational diabetes mellitus in childbirth, controlled by oral hypoglycemic drugs: Secondary | ICD-10-CM | POA: Diagnosis present

## 2015-09-29 DIAGNOSIS — Z833 Family history of diabetes mellitus: Secondary | ICD-10-CM | POA: Diagnosis not present

## 2015-09-29 DIAGNOSIS — Z3A38 38 weeks gestation of pregnancy: Secondary | ICD-10-CM | POA: Diagnosis not present

## 2015-09-29 DIAGNOSIS — O99214 Obesity complicating childbirth: Secondary | ICD-10-CM | POA: Diagnosis present

## 2015-09-29 DIAGNOSIS — O139 Gestational [pregnancy-induced] hypertension without significant proteinuria, unspecified trimester: Secondary | ICD-10-CM | POA: Diagnosis present

## 2015-09-29 DIAGNOSIS — Z6839 Body mass index (BMI) 39.0-39.9, adult: Secondary | ICD-10-CM

## 2015-09-29 LAB — CBC
HCT: 37.7 % (ref 36.0–46.0)
HCT: 34.5 % — ABNORMAL LOW (ref 36.0–46.0)
HEMOGLOBIN: 12.6 g/dL (ref 12.0–15.0)
Hemoglobin: 11.3 g/dL — ABNORMAL LOW (ref 12.0–15.0)
MCH: 28.3 pg (ref 26.0–34.0)
MCH: 27.8 pg (ref 26.0–34.0)
MCHC: 33.4 g/dL (ref 30.0–36.0)
MCHC: 32.8 g/dL (ref 30.0–36.0)
MCV: 84.7 fL (ref 78.0–100.0)
MCV: 84.8 fL (ref 78.0–100.0)
PLATELETS: 168 10*3/uL (ref 150–400)
PLATELETS: 149 10*3/uL — AB (ref 150–400)
RBC: 4.45 MIL/uL (ref 3.87–5.11)
RBC: 4.07 MIL/uL (ref 3.87–5.11)
RDW: 14.4 % (ref 11.5–15.5)
RDW: 14.4 % (ref 11.5–15.5)
WBC: 5.9 10*3/uL (ref 4.0–10.5)
WBC: 8.2 10*3/uL (ref 4.0–10.5)

## 2015-09-29 LAB — COMPREHENSIVE METABOLIC PANEL
ALK PHOS: 123 U/L (ref 38–126)
ALT: 20 U/L (ref 14–54)
ANION GAP: 8 (ref 5–15)
AST: 21 U/L (ref 15–41)
Albumin: 3 g/dL — ABNORMAL LOW (ref 3.5–5.0)
BILIRUBIN TOTAL: 0.4 mg/dL (ref 0.3–1.2)
BUN: 6 mg/dL (ref 6–20)
CALCIUM: 8.8 mg/dL — AB (ref 8.9–10.3)
CO2: 20 mmol/L — ABNORMAL LOW (ref 22–32)
CREATININE: 0.46 mg/dL (ref 0.44–1.00)
Chloride: 108 mmol/L (ref 101–111)
GFR calc non Af Amer: >60 mL/min (ref >60)
GLUCOSE: 82 mg/dL (ref 65–99)
Potassium: 4 mmol/L (ref 3.5–5.1)
Sodium: 136 mmol/L (ref 135–145)
TOTAL PROTEIN: 6.5 g/dL (ref 6.5–8.1)

## 2015-09-29 LAB — TYPE AND SCREEN
ABO/RH(D): O POS
ANTIBODY SCREEN: NEGATIVE

## 2015-09-29 MED ORDER — ACETAMINOPHEN 325 MG PO TABS
650.0000 mg | ORAL_TABLET | ORAL | Status: DC | PRN
  Administered 2015-09-29: 650 mg via ORAL
  Filled 2015-09-29: qty 2

## 2015-09-29 MED ORDER — ONDANSETRON HCL 4 MG/2ML IJ SOLN: 4.0000 mg | INTRAMUSCULAR | Status: DC | PRN

## 2015-09-29 MED ORDER — OXYTOCIN 40 UNITS IN LACTATED RINGERS INFUSION - SIMPLE MED
1.0000 m[IU]/min | INTRAVENOUS | Status: DC
  Administered 2015-09-29: 2 m[IU]/min via INTRAVENOUS
  Filled 2015-09-29: qty 1000

## 2015-09-29 MED ORDER — DIPHENHYDRAMINE HCL 50 MG/ML IJ SOLN: 12.5000 mg | INTRAMUSCULAR | Status: DC | PRN

## 2015-09-29 MED ORDER — OXYCODONE-ACETAMINOPHEN 5-325 MG PO TABS: 2.0000 | ORAL_TABLET | ORAL | Status: DC | PRN

## 2015-09-29 MED ORDER — SIMETHICONE 80 MG PO CHEW
80.0000 mg | CHEWABLE_TABLET | ORAL | Status: DC | PRN
Start: 2015-09-29 — End: 2015-10-01

## 2015-09-29 MED ORDER — DIPHENHYDRAMINE HCL 25 MG PO CAPS: 25.0000 mg | ORAL_CAPSULE | Freq: Four times a day (QID) | ORAL | Status: DC | PRN

## 2015-09-29 MED ORDER — ONDANSETRON HCL 4 MG PO TABS: 4.0000 mg | ORAL_TABLET | ORAL | Status: DC | PRN

## 2015-09-29 MED ORDER — LIDOCAINE HCL (PF) 1 % IJ SOLN
30.0000 mL | INTRAMUSCULAR | Status: DC | PRN
  Filled 2015-09-29: qty 30

## 2015-09-29 MED ORDER — CITRIC ACID-SODIUM CITRATE 334-500 MG/5ML PO SOLN: 30.0000 mL | ORAL | Status: DC | PRN

## 2015-09-29 MED ORDER — BENZOCAINE-MENTHOL 20-0.5 % EX AERO: 1.0000 "application " | INHALATION_SPRAY | CUTANEOUS | Status: DC | PRN

## 2015-09-29 MED ORDER — ZOLPIDEM TARTRATE 5 MG PO TABS: 5.0000 mg | ORAL_TABLET | Freq: Every evening | ORAL | Status: DC | PRN

## 2015-09-29 MED ORDER — DIBUCAINE 1 % RE OINT: 1.0000 "application " | TOPICAL_OINTMENT | RECTAL | Status: DC | PRN

## 2015-09-29 MED ORDER — WITCH HAZEL-GLYCERIN EX PADS: 1.0000 "application " | MEDICATED_PAD | CUTANEOUS | Status: DC | PRN

## 2015-09-29 MED ORDER — OXYTOCIN 40 UNITS IN LACTATED RINGERS INFUSION - SIMPLE MED: 62.5000 mL/h | INTRAVENOUS | Status: DC

## 2015-09-29 MED ORDER — IBUPROFEN 600 MG PO TABS
600.0000 mg | ORAL_TABLET | Freq: Four times a day (QID) | ORAL | Status: DC
  Administered 2015-09-30 – 2015-10-01 (×7): 600 mg via ORAL
  Filled 2015-09-29 (×7): qty 1

## 2015-09-29 MED ORDER — FENTANYL 2.5 MCG/ML BUPIVACAINE 1/10 % EPIDURAL INFUSION (WH - ANES)
14.0000 mL/h | INTRAMUSCULAR | Status: DC | PRN
  Administered 2015-09-29 (×2): 14 mL/h via EPIDURAL
  Filled 2015-09-29: qty 125

## 2015-09-29 MED ORDER — ONDANSETRON HCL 4 MG/2ML IJ SOLN: 4.0000 mg | Freq: Four times a day (QID) | INTRAMUSCULAR | Status: DC | PRN

## 2015-09-29 MED ORDER — OXYCODONE-ACETAMINOPHEN 5-325 MG PO TABS: 1.0000 | ORAL_TABLET | ORAL | Status: DC | PRN

## 2015-09-29 MED ORDER — LACTATED RINGERS IV SOLN
INTRAVENOUS | Status: DC
Start: 2015-09-29 — End: 2015-09-29
  Administered 2015-09-29 (×2): via INTRAVENOUS

## 2015-09-29 MED ORDER — OXYTOCIN BOLUS FROM INFUSION: 500.0000 mL | INTRAVENOUS | Status: DC

## 2015-09-29 MED ORDER — TETANUS-DIPHTH-ACELL PERTUSSIS 5-2.5-18.5 LF-MCG/0.5 IM SUSP: 0.5000 mL | Freq: Once | INTRAMUSCULAR | Status: DC

## 2015-09-29 MED ORDER — PHENYLEPHRINE 40 MCG/ML (10ML) SYRINGE FOR IV PUSH (FOR BLOOD PRESSURE SUPPORT)
80.0000 ug | PREFILLED_SYRINGE | INTRAVENOUS | Status: DC | PRN
  Filled 2015-09-29: qty 20
  Filled 2015-09-29: qty 2

## 2015-09-29 MED ORDER — LANOLIN HYDROUS EX OINT
TOPICAL_OINTMENT | CUTANEOUS | Status: DC | PRN
Start: 2015-09-29 — End: 2015-10-01

## 2015-09-29 MED ORDER — LIDOCAINE HCL (PF) 1 % IJ SOLN
INTRAMUSCULAR | Status: DC | PRN
  Administered 2015-09-29 (×2): 4 mL

## 2015-09-29 MED ORDER — EPHEDRINE 5 MG/ML INJ
10.0000 mg | INTRAVENOUS | Status: DC | PRN
  Filled 2015-09-29: qty 2

## 2015-09-29 MED ORDER — ACETAMINOPHEN 325 MG PO TABS
650.0000 mg | ORAL_TABLET | ORAL | Status: DC | PRN
  Administered 2015-09-29 – 2015-09-30 (×2): 650 mg via ORAL
  Filled 2015-09-29 (×2): qty 2

## 2015-09-29 MED ORDER — SENNOSIDES-DOCUSATE SODIUM 8.6-50 MG PO TABS
2.0000 | ORAL_TABLET | ORAL | Status: DC
  Administered 2015-09-30 (×2): 2 via ORAL
  Filled 2015-09-29 (×2): qty 2

## 2015-09-29 MED ORDER — LACTATED RINGERS IV SOLN: 500.0000 mL | INTRAVENOUS | Status: DC | PRN

## 2015-09-29 MED ORDER — PRENATAL MULTIVITAMIN CH
1.0000 | ORAL_TABLET | Freq: Every day | ORAL | Status: DC
  Administered 2015-09-30 – 2015-10-01 (×2): 1 via ORAL
  Filled 2015-09-29 (×2): qty 1

## 2015-09-29 MED ORDER — BUTORPHANOL TARTRATE 1 MG/ML IJ SOLN: 1.0000 mg | INTRAMUSCULAR | Status: DC | PRN

## 2015-09-29 MED ORDER — TERBUTALINE SULFATE 1 MG/ML IJ SOLN
0.2500 mg | Freq: Once | INTRAMUSCULAR | Status: DC | PRN
  Filled 2015-09-29: qty 1

## 2015-09-29 NOTE — Progress Notes (Signed)
Patient ID: [name] Guerra, female   DOB: May 09, 1981, 34 y.o.   MRN: [id number] Pt comfortable with epidural  afeb vss Cervix 60/3-4/-2  IUPC placed to adjust pitocin  FHR category 1 Follow progress

## 2015-09-29 NOTE — Progress Notes (Signed)
Patient ID: [name] Guerra, female   DOB: 07-25-1981, 34 y.o.   MRN: [id number] BP stable in labor 130/90's, PIH labs WNL Will just follow BP closely pp

## 2015-09-29 NOTE — Anesthesia Procedure Notes (Signed)
Epidural Patient location during procedure: OB  Staffing Anesthesiologist: Rafan Sanders Performed by: anesthesiologist   Preanesthetic Checklist Completed: patient identified, site marked, surgical consent, pre-op evaluation, timeout performed, IV checked, risks and benefits discussed and monitors and equipment checked  Epidural Patient position: sitting Prep: site prepped and draped and DuraPrep Patient monitoring: continuous pulse ox and blood pressure Approach: midline Location: L3-L4 Injection technique: LOR saline  Needle:  Needle type: Tuohy  Needle gauge: 17 G Needle length: 9 cm and 9 Needle insertion depth: 7 cm Catheter type: closed end flexible Catheter size: 19 Gauge Catheter at skin depth: 13 cm Test dose: negative  Assessment Events: blood not aspirated, injection not painful, no injection resistance, negative IV test and no paresthesia  Additional Notes Patient identified. Risks/Benefits/Options discussed with patient including but not limited to bleeding, infection, nerve damage, paralysis, failed block, incomplete pain control, headache, blood pressure changes, nausea, vomiting, reactions to medication both or allergic, itching and postpartum back pain. Confirmed with bedside nurse the patient's most recent platelet count. Confirmed with patient that they are not currently taking any anticoagulation, have any bleeding history or any family history of bleeding disorders. Patient expressed understanding and wished to proceed. All questions were answered. Sterile technique was used throughout the entire procedure. Please see nursing notes for vital signs. Test dose was given through epidural catheter and negative prior to continuing to dose epidural or start infusion. Warning signs of high block given to the patient including shortness of breath, tingling/numbness in hands, complete motor block, or any concerning symptoms with instructions to call for help. Patient was  given instructions on fall risk and not to get out of bed. All questions and concerns addressed with instructions to call with any issues or inadequate analgesia.

## 2015-09-29 NOTE — Lactation Note (Signed)
This note was copied from the chart of [name] Guerra. Lactation Consultation Note  Patient Name: [name] Guerra ZOXWR'UToday's Date: 09/29/2015 Reason for consult: Initial assessment Baby at 5 hr old and mom worried about latch. Her 2 oldest were formula fed because they would not latch. The last 3 started latching in the hospital and then mom felt like she had no support at home so she would pump to feed. FOB stated that mom makes milk for twins. Mom was able to pump for 7211m for the last 3 babies and have milk for 3235m. Baby has a very wet mouth with bubbles. She seems like she is hungry after moving her around but she just holds the nipple in her mouth. Mom does have a short shaft nipple. Discussed baby behavior, feeding frequency, baby belly size, voids, wt loss, breast changes, and nipple care. Given lactation handouts. Aware of OP services and support group.     Maternal Data Has patient been taught Hand Expression?: Yes Does the patient have breastfeeding experience prior to this delivery?: Yes  Feeding Feeding Type: Breast Fed Length of feed: 15 min (on and off)  LATCH Score/Interventions Latch: Repeated attempts needed to sustain latch, nipple held in mouth throughout feeding, stimulation needed to elicit sucking reflex. Intervention(s): Assist with latch;Adjust position  Audible Swallowing: None Intervention(s): Skin to skin;Hand expression  Type of Nipple: Everted at rest and after stimulation  Comfort (Breast/Nipple): Soft / non-tender     Hold (Positioning): Assistance needed to correctly position infant at breast and maintain latch. Intervention(s): Support Pillows;Position options  LATCH Score: 6  Lactation Tools Discussed/Used WIC Program: Yes   Consult Status Consult Status: Follow-up Date: 09/30/15 Follow-up type: In-patient    [name] Guerra 09/29/2015, 10:40 PM

## 2015-09-29 NOTE — Progress Notes (Signed)
Patient ID: [name] Guerra, female   DOB: 29-May-1981, 34 y.o.   MRN: [id number] Pt feeling minimal contractions afeb BP 130/90's FHR category 1  50/1+/-2 AROM clear  Pitocin starting  Follow progress CBC WNL, awaiting other labs No PIH sx

## 2015-09-29 NOTE — Anesthesia Preprocedure Evaluation (Signed)
Anesthesia Evaluation  Patient identified by MRN, date of birth, ID band Patient awake    Reviewed: Allergy & Precautions, NPO status , Patient's Chart, lab work & pertinent test results  History of Anesthesia Complications Negative for: history of anesthetic complications  Airway Mallampati: III  TM Distance: >3 FB Neck ROM: Full    Dental no notable dental hx. (+) Dental Advisory Given   Pulmonary neg pulmonary ROS,    Pulmonary exam normal breath sounds clear to auscultation       Cardiovascular hypertension, Normal cardiovascular exam Rhythm:Regular Rate:Normal     Neuro/Psych negative neurological ROS  negative psych ROS   GI/Hepatic negative GI ROS, Neg liver ROS,   Endo/Other  diabetes, GestationalMorbid obesity  Renal/GU negative Renal ROS  negative genitourinary   Musculoskeletal negative musculoskeletal ROS (+)   Abdominal   Peds negative pediatric ROS (+)  Hematology negative hematology ROS (+)   Anesthesia Other Findings   Reproductive/Obstetrics (+) Pregnancy                             Anesthesia Physical Anesthesia Plan  ASA: III  Anesthesia Plan: Epidural   Post-op Pain Management:    Induction:   Airway Management Planned:   Additional Equipment:   Intra-op Plan:   Post-operative Plan:   Informed Consent: I have reviewed the patients History and Physical, chart, labs and discussed the procedure including the risks, benefits and alternatives for the proposed anesthesia with the patient or authorized representative who has indicated his/her understanding and acceptance.   Dental advisory given  Plan Discussed with: CRNA  Anesthesia Plan Comments:         Anesthesia Quick Evaluation

## 2015-09-30 LAB — CBC
HEMATOCRIT: 33.8 % — AB (ref 36.0–46.0)
Hemoglobin: 11.3 g/dL — ABNORMAL LOW (ref 12.0–15.0)
MCH: 28.3 pg (ref 26.0–34.0)
MCHC: 33.4 g/dL (ref 30.0–36.0)
MCV: 84.7 fL (ref 78.0–100.0)
Platelets: 152 10*3/uL (ref 150–400)
RBC: 3.99 MIL/uL (ref 3.87–5.11)
RDW: 14.4 % (ref 11.5–15.5)
WBC: 8.3 10*3/uL (ref 4.0–10.5)

## 2015-09-30 LAB — RPR: RPR Ser Ql: NONREACTIVE

## 2015-09-30 NOTE — Lactation Note (Addendum)
This note was copied from the chart of [name] Guerra. Lactation Consultation Note  Patient Name: [name] Guerra ZOXWR'UToday's Date: 09/30/2015 Reason for consult: Follow-up assessment;Hyperbilirubinemia   Visited with Mom, baby 2718 hrs old.  Baby under triple phototherapy, +DAT, T. Bili 11.6 at 12 hrs old.  Baby having trouble latching, falling asleep and needing stimulation.  Talked to Mom about supplementing with formula/EBM by slow flow bottle while under the triple phototherapy at present, due to rapid elevation of bilirubin.  Mom pumping both breasts, and will continue to every 3 hrs for 15-20 mins (30 mm flanges given as Mom states 27s are too small)  Encouraged feedings at least every 3 hrs, feeding sooner if showing feeding cues.  Mom to stand beside crib to feed baby for now.  Mom agreeable to this plan.  Baby to go skin to skin as needed if baby fussy, always trying to keep phototherapy paddle on her back.  To follow up as needed today, and tomorrow.    Consult Status Consult Status: Follow-up Date: 10/01/15 Follow-up type: In-patient    [name] Guerra, [name] Guerra 09/30/2015, 11:45 AM

## 2015-09-30 NOTE — Discharge Summary (Signed)
Obstetric Discharge Summary Reason for Admission: [facility] Prenatal Procedures: NST Intrapartum Procedures: spontaneous vaginal delivery Postpartum Procedures: none Complications-Operative and Postpartum: none HEMOGLOBIN  Date Value Ref Range Status  09/30/2015 11.3* 12.0 - 15.0 g/dL Final   HCT  Date Value Ref Range Status  09/30/2015 33.8* 36.0 - 46.0 % Final    Physical Exam:  General: alert and cooperative Lochia: appropriate Uterine Fundus: firm   Discharge Diagnoses: Term Pregnancy-delivered  Discharge Information: Date: 09/30/2015 Activity: pelvic rest Diet: routine Medications: Ibuprofen and Percocet Condition: improved Instructions: refer to practice specific booklet Discharge to: home Follow-up Information    Follow up with [name],Sione Baumgarten W, MD. Schedule an appointment as soon as possible for a visit in 6 weeks.   Specialty:  Obstetrics and Gynecology   Why:  postpartum   Contact information:   [address] [place] [address] [phone]       Newborn Data: Live born female  Birth Weight: 7 lb 13.8 oz (3565 g) APGAR: 8, 9  Baby in NICU for hyperbilirubinemia, stable  Havier Deeb W 09/30/2015, 10:39 PM

## 2015-09-30 NOTE — Progress Notes (Signed)
Post Partum Day 1 Subjective: no complaints and tolerating PO.   Baby under bililights  Objective: Blood pressure 135/86, pulse 92, temperature 97.7 F (36.5 C), temperature source Oral, resp. rate 18, height 5\' 9"  (1.753 m), weight 119.84 kg (264 lb 3.2 oz), last menstrual period 01/06/2015, SpO2 100 %, unknown if currently breastfeeding.  Physical Exam:  General: alert and cooperative Lochia: appropriate Uterine Fundus: firm   Recent Labs  09/29/15 1851 09/30/15 0520  HGB 11.3* 11.3*  HCT 34.5* 33.8*    Assessment/Plan: Plan for discharge tomorrow   LOS: 1 day   Belen Pesch W 09/30/2015, 8:39 AM

## 2015-09-30 NOTE — Anesthesia Postprocedure Evaluation (Signed)
Anesthesia Post Note  Patient: [name] Guerra  Procedure(s) Performed: * No procedures listed *  Patient location during evaluation: Mother Baby Anesthesia Type: Epidural Level of consciousness: awake, awake and alert and oriented Pain management: pain level controlled Vital Signs Assessment: post-procedure vital signs reviewed and stable Respiratory status: spontaneous breathing, nonlabored ventilation and respiratory function stable Cardiovascular status: blood pressure returned to baseline Postop Assessment: no headache, no backache, patient able to bend at knees, no signs of nausea or vomiting and adequate PO intake Anesthetic complications: no    Last Vitals:  Filed Vitals:   09/30/15 0006 09/30/15 0413  BP: 137/86 135/86  Pulse: 103 92  Temp: 37 C 36.5 C  Resp: 18 18    Last Pain:  Filed Vitals:   09/30/15 0414  PainSc: 1                  Malesha Suliman

## 2015-10-01 MED ORDER — IBUPROFEN 600 MG PO TABS: 600.0000 mg | ORAL_TABLET | Freq: Four times a day (QID) | ORAL | Status: DC

## 2015-10-01 NOTE — Lactation Note (Addendum)
This note was copied from the chart of [name] Guerra. Lactation Consultation Note; Baby was transferred to NICU yesterday due to elevated bili. Mom has been pumping since yesterday every 2-3 hours except for one 5 hour stretch when she slept last night. Obtaining about 5-10 cc's Colostrum/pumping this morning. Reports breasts are feeling a little fuller this morning. Pumping as I went into room.Had asked for a larger flanges- #36 flanges given and mom reports that feels better. Breastfeeding your baby in the NICU booklet given to mom. Reviewed briefly with mom. Experienced BF mom. Discussed pumping rooms in the NICU- to bring pump pieces with her when she comes to visit baby. Mom has Medela pump for home.No questions at present. To call prn Patient Name: [name] Guerra WUJWJ'XToday's Date: 10/01/2015 Reason for consult: Follow-up assessment;NICU baby   Maternal Data Formula Feeding for Exclusion: No Has patient been taught Hand Expression?: Yes Does the patient have breastfeeding experience prior to this delivery?: Yes  Feeding   LATCH Score/Interventions                      Lactation Tools Discussed/Used Pump Review: Setup, frequency, and cleaning   Consult Status Consult Status: Complete    Pamelia HoitWeeks, Terese Heier D 10/01/2015, 11:07 AM

## 2015-10-01 NOTE — Progress Notes (Signed)
Post Partum Day 2 Subjective: no complaints, voiding and tolerating PO   Pt seen in NICU, baby admitted for hyperbilirubinemia  Objective: Blood pressure 127/86, pulse 93, temperature 97.7 F (36.5 C), temperature source Oral, resp. rate 18, height 5\' 9"  (1.753 m), weight 119.84 kg (264 lb 3.2 oz), last menstrual period 01/06/2015, SpO2 97 %, unknown if currently breastfeeding.  Physical Exam:  General: alert and cooperative Lochia: appropriate Uterine Fundus: firm    Recent Labs  09/29/15 1851 09/30/15 0520  HGB 11.3* 11.3*  HCT 34.5* 33.8*    Assessment/Plan: Discharge home   LOS: 2 days   Melicia Esqueda W 10/01/2015, 8:55 AM

## 2015-10-01 NOTE — Progress Notes (Signed)
CSW acknowledges NICU admission.    Patient screened out for psychosocial assessment since none of the following apply:  Psychosocial stressors documented in mother or baby's chart  Gestation less than 32 weeks  Code at delivery   Infant with anomalies  Please contact the Clinical Social Worker if specific needs arise, or by MOB's request.       

## 2015-10-01 NOTE — Progress Notes (Signed)
Patient pumping regularly and now expressing 20 cc transitional milk. She is using a size 30 flange; however, the left nipple is still showing signs of friction with pumping. Patient has been lubricating the sides of that nipple with a lanolin cream preparation. Will ask LC to assess. Patient given comfort gel for that nipple, but advised not to use the lanolin cream concurrently.

## 2015-10-02 ENCOUNTER — Ambulatory Visit: Payer: Self-pay

## 2015-10-02 NOTE — Lactation Note (Signed)
This note was copied from the chart of [name] Guerra. Lactation Consultation Note  Patient Name: [name] Guerra WUJWJ'XToday's Date: 10/02/2015 Reason for consult: Follow-up assessment    With this mom of a term NICU baby, now 363 hours old, and in NICU for high bili/phototherapy. The baby was made ad lib demand today. Mom attempted to breast feed, but the baby was too sleepy to latch. I advised mom to keep baby skin to skin, and see if she begins to cue. Since she needed to go under phototherapy, a bottle of EBM was being fed to the baby. Mom will try to breast feed again later today, and hopefully the baby will be showing better feeding cues.  Mom was concerned that she was not expressing enough milk. She is expressing 30-34 ml's every 2 hours, which is a very good amount for 63 hours post partum. I assisted mom with pumping, she is full, with better expression from her left breast tan right. Mom states this is normal for her. She is tender, engorged mostly on the under and outside of right breast. I advised mom to add ice for swelling, once she is not holding baby. Mom was also using a 36 flange on the right, I decreased her to 30, and mom felt this was better. Mom is going to purchase coconut oil, and use this in the flanges, to decrease friction. Mom also given comfort gels, which mom said helped much. Mom knows to call for lactation as needed.    Maternal Data    Feeding Feeding Type: Breast Fed  LATCH Score/Interventions Latch: Too sleepy or reluctant, no latch achieved, no sucking elicited. Intervention(s): Skin to skin;Teach feeding cues;Waking techniques Intervention(s): Adjust position;Assist with latch     Type of Nipple: Everted at rest and after stimulation  Comfort (Breast/Nipple): Engorged, cracked, bleeding, large blisters, severe discomfort Problem noted: Engorgment (outer side of right breast only) Intervention(s): Ice  Problem noted: Mild/Moderate  discomfort;Filling Interventions (Mild/moderate discomfort): Comfort gels  Hold (Positioning): Assistance needed to correctly position infant at breast and maintain latch. Intervention(s): Breastfeeding basics reviewed;Support Pillows;Position options;Skin to skin     Lactation Tools Discussed/Used     Consult Status Consult Status: PRN Follow-up type: In-patient (NNICU)    [name] Guerra, [name] Guerra 10/02/2015, 10:25 AM

## 2015-10-03 ENCOUNTER — Ambulatory Visit: Payer: Self-pay

## 2015-10-03 NOTE — Lactation Note (Signed)
This note was copied from the chart of [name] Guerra. Lactation Consultation Note  Patient Name: [name] Guerra ZOXWR'UToday's Date: 10/03/2015 Reason for consult: Follow-up assessment;NICU baby;Difficult latch   Called to NICU for baby who is rooming in and being D/C today. Mom c/o pumping blood to right breast today. Bottle of EBM at bedside and does contain frank blood. Mom report she just pumped left side and obtained 3 oz EBM. Mom was pumping right side using # 36 flange, milk dripping very slowly. Breast full and engorged, breast is very reddened to outer aspect. Mom report she experienced achiness and chills yesterday. She has not been pumping right side as often as it is painful. Discussed Engorgement protocol and Mastitis. Enc mom to call OB to report mastitis and to obtain antibiotics today. Enc mom to pump both breast 8-12 x in 24 hours for 15 minutes and to use ice on right breast prior to pumping, massaging breast during pumping. Mom reports flow to right side is diminished. Scab noted to right nipple. Mom was using Symphony pump on lowest setting, Enc her to use # 36 flange a few more times to minimize pain and then try to decrease to smaller flange as tolerated. Stressed importance of emptying breast to maintain milk supply and to treat mastitis. Engorgement treatment for BF mother handout given and explained to mom. Gave mom LC phone # and encouraged her to call with questions/concerns. Discussed findings with infant's nurse, Jacki ConesLaurie.   Maternal Data Formula Feeding for Exclusion: No Has patient been taught Hand Expression?: Yes  Feeding Feeding Type: Breast Milk Nipple Type: Slow - flow Length of feed: 25 min  LATCH Score/Interventions                      Lactation Tools Discussed/Used Pump Review: Setup, frequency, and cleaning;Milk Storage   Consult Status Consult Status: PRN Follow-up type: Call as needed    Ed BlalockSharon S Taelor Waymire 10/03/2015, 1:14  PM

## 2015-10-03 NOTE — Lactation Note (Signed)
This note was copied from the chart of [name] Guerra. Lactation Consultation Note  Patient Name: [name] Guerra YQMVH'QToday's Date: 10/03/2015 Reason for consult: Follow-up assessment;NICU baby;Difficult latch   Mom finished pumping after 30 minutes, she pumped 90 cc from right breast. Blood not visible in pumped EBM. Right breast with blister to bottom of nipple. Enc mom to pump for 15 minutes or 2 minutes post milk flowing. Nipple to right breast larger than a quarter. Mom reports she was never able to latch older child to that nipple. Enc her to pump every 2-3 hours on right side and to continue to feed infant on left side. Mom asking about a nipple shield, told her I did not think a NS would fit on her right nipple right now.  Advised mom to call once mastitis resolving and nipple heals and infant grows a little to obtain assistance with latching infant to right side. Mom using coconut oil to nipple. Mom did call OB and got a prescription for ATB. Mom reports she had mastitis several times on the right side with older child. She reports she is able to pump large volumes of milk  With older child. Enc her to maintain frequent emptying of both breast and we can either assist her in latching infant to the right side when she is bigger or assist her in gradual weaning of right breast if infant able to be fully BF on the left. Enc mom to call as needed.   Maternal Data Formula Feeding for Exclusion: No Has patient been taught Hand Expression?: Yes  Feeding Feeding Type: Breast Milk Nipple Type: Slow - flow Length of feed: 25 min  LATCH Score/Interventions                      Lactation Tools Discussed/Used Pump Review: Setup, frequency, and cleaning;Milk Storage   Consult Status Consult Status: PRN Follow-up type: Call as needed    Ed BlalockSharon S Phi Avans 10/03/2015, 2:16 PM

## 2015-10-06 ENCOUNTER — Inpatient Hospital Stay (HOSPITAL_COMMUNITY): Admission: RE | Admit: 2015-10-06 | Payer: 59 | Source: Ambulatory Visit

## 2015-10-10 ENCOUNTER — Encounter (HOSPITAL_COMMUNITY): Payer: Self-pay | Admitting: *Deleted

## 2015-10-10 ENCOUNTER — Observation Stay (HOSPITAL_COMMUNITY): Payer: 59

## 2015-10-10 ENCOUNTER — Observation Stay (HOSPITAL_COMMUNITY)
Admission: AD | Admit: 2015-10-10 | Discharge: 2015-10-12 | Disposition: A | Discharge status: Home / Self Care | Payer: 59 | Source: Ambulatory Visit | Attending: Obstetrics and Gynecology | Admitting: Obstetrics and Gynecology

## 2015-10-10 DIAGNOSIS — I499 Cardiac arrhythmia, unspecified: Secondary | ICD-10-CM | POA: Insufficient documentation

## 2015-10-10 DIAGNOSIS — R509 Fever, unspecified: Secondary | ICD-10-CM | POA: Insufficient documentation

## 2015-10-10 DIAGNOSIS — K5792 Diverticulitis of intestine, part unspecified, without perforation or abscess without bleeding: Secondary | ICD-10-CM | POA: Insufficient documentation

## 2015-10-10 DIAGNOSIS — N644 Mastodynia: Secondary | ICD-10-CM | POA: Diagnosis present

## 2015-10-10 DIAGNOSIS — I10 Essential (primary) hypertension: Secondary | ICD-10-CM

## 2015-10-10 DIAGNOSIS — N61 Mastitis without abscess: Principal | ICD-10-CM | POA: Insufficient documentation

## 2015-10-10 DIAGNOSIS — Z8669 Personal history of other diseases of the nervous system and sense organs: Secondary | ICD-10-CM | POA: Insufficient documentation

## 2015-10-10 DIAGNOSIS — B999 Unspecified infectious disease: Secondary | ICD-10-CM

## 2015-10-10 DIAGNOSIS — O9122 Nonpurulent mastitis associated with the puerperium: Secondary | ICD-10-CM

## 2015-10-10 LAB — COMPREHENSIVE METABOLIC PANEL
ALT: 35 U/L (ref 14–54)
AST: 27 U/L (ref 15–41)
Albumin: 3.2 g/dL — ABNORMAL LOW (ref 3.5–5.0)
Alkaline Phosphatase: 117 U/L (ref 38–126)
Anion gap: 9 (ref 5–15)
BUN: 13 mg/dL (ref 6–20)
CALCIUM: 8.7 mg/dL — AB (ref 8.9–10.3)
CHLORIDE: 108 mmol/L (ref 101–111)
CO2: 24 mmol/L (ref 22–32)
CREATININE: 0.78 mg/dL (ref 0.44–1.00)
Glucose, Bld: 91 mg/dL (ref 65–99)
Potassium: 4.1 mmol/L (ref 3.5–5.1)
Sodium: 141 mmol/L (ref 135–145)
Total Bilirubin: 0.4 mg/dL (ref 0.3–1.2)
Total Protein: 6.8 g/dL (ref 6.5–8.1)

## 2015-10-10 LAB — CBC WITH DIFFERENTIAL/PLATELET
BASOS ABS: 0 10*3/uL (ref 0.0–0.1)
Basophils Relative: 0 %
EOS PCT: 1 %
Eosinophils Absolute: 0.1 10*3/uL (ref 0.0–0.7)
HEMATOCRIT: 37.6 % (ref 36.0–46.0)
Hemoglobin: 12 g/dL (ref 12.0–15.0)
LYMPHS PCT: 14 %
Lymphs Abs: 0.9 10*3/uL (ref 0.7–4.0)
MCH: 27.7 pg (ref 26.0–34.0)
MCHC: 31.9 g/dL (ref 30.0–36.0)
MCV: 86.8 fL (ref 78.0–100.0)
MONO ABS: 0.4 10*3/uL (ref 0.1–1.0)
MONOS PCT: 6 %
NEUTROS ABS: 4.9 10*3/uL (ref 1.7–7.7)
Neutrophils Relative %: 79 %
PLATELETS: 255 10*3/uL (ref 150–400)
RBC: 4.33 MIL/uL (ref 3.87–5.11)
RDW: 14.7 % (ref 11.5–15.5)
WBC: 6.3 10*3/uL (ref 4.0–10.5)

## 2015-10-10 LAB — URINE MICROSCOPIC-ADD ON: WBC, UA: NONE SEEN WBC/hpf (ref 0–5)

## 2015-10-10 LAB — URINALYSIS, ROUTINE W REFLEX MICROSCOPIC
Bilirubin Urine: NEGATIVE
GLUCOSE, UA: NEGATIVE mg/dL
KETONES UR: NEGATIVE mg/dL
LEUKOCYTES UA: NEGATIVE
NITRITE: NEGATIVE
PH: 6 (ref 5.0–8.0)
PROTEIN: NEGATIVE mg/dL
Specific Gravity, Urine: 1.02 (ref 1.005–1.030)

## 2015-10-10 LAB — GLUCOSE, CAPILLARY
GLUCOSE-CAPILLARY: 130 mg/dL — AB (ref 65–99)
GLUCOSE-CAPILLARY: 113 mg/dL — AB (ref 65–99)

## 2015-10-10 MED ORDER — OXYCODONE-ACETAMINOPHEN 5-325 MG PO TABS
1.0000 | ORAL_TABLET | ORAL | Status: DC | PRN
  Administered 2015-10-10 – 2015-10-11 (×4): 1 via ORAL
  Administered 2015-10-11: 2 via ORAL
  Filled 2015-10-10 (×4): qty 1
  Filled 2015-10-10: qty 2

## 2015-10-10 MED ORDER — OXYCODONE-ACETAMINOPHEN 5-325 MG PO TABS
1.0000 | ORAL_TABLET | ORAL | Status: DC | PRN
Start: 2015-10-10 — End: 2015-10-10
  Administered 2015-10-10: 1 via ORAL
  Filled 2015-10-10: qty 1

## 2015-10-10 MED ORDER — LACTATED RINGERS IV SOLN
INTRAVENOUS | Status: DC
  Administered 2015-10-10 (×3): via INTRAVENOUS

## 2015-10-10 MED ORDER — CEFAZOLIN SODIUM-DEXTROSE 2-3 GM-% IV SOLR
2.0000 g | Freq: Three times a day (TID) | INTRAVENOUS | Status: DC
  Administered 2015-10-10 – 2015-10-12 (×7): 2 g via INTRAVENOUS
  Filled 2015-10-10 (×8): qty 50

## 2015-10-10 MED ORDER — ACETAMINOPHEN 500 MG PO TABS
1000.0000 mg | ORAL_TABLET | Freq: Once | ORAL | Status: AC
  Administered 2015-10-10: 1000 mg via ORAL
  Filled 2015-10-10: qty 2

## 2015-10-10 MED ORDER — IBUPROFEN 600 MG PO TABS
600.0000 mg | ORAL_TABLET | Freq: Four times a day (QID) | ORAL | Status: DC | PRN
  Administered 2015-10-10 – 2015-10-11 (×5): 600 mg via ORAL
  Filled 2015-10-10 (×5): qty 1

## 2015-10-10 NOTE — Lactation Note (Addendum)
Lactation Consultation Note  Patient Name: [name] Guerra Date: 10/10/2015   Initial consult with mom who was readmitted for suspected mastitis in right breast. Mom took infant home last Saturday and was noted to have very painful right breast with large reddened area to outer breast with chills and achiness. Mom did call OB last week and was started on ATB. Mom reports she has not BF infant on either side since she left the hospital. She has been pumping every 3 hours and bottle feeding. Mom is currently pumping about 8 oz from left breast every 3 hours and had been getting that amount from right side also. She used a newer PIS and did not get good milk flow so she changed to an older PIS that a family member gave her and it worked better. She reports that she has been pumping every 3 hours until last night. Mom notes that she has had a lot of bleeding/blood in milk to right breast or nipple area. She has only pumped right side x 2 since readmit and is now very engorged on the right side and is in a lot of pain. The breast is noted to be slightly pink along the top of the breast. Mom has been using a #36 flange on the right per OB recommendation and that she notes that the nipple tissue is being drawn into the barrel of the flange with little movement during pumping. Mom and I discussed need for nipple tissue to be able to pull in and out freely and not be pulled into flange and recommended that mom use # 30 flange for pumping on highest setting she can tolerate without pain. Right nipple noted to be reddened and scabbed to end of nipple, mom report she has been using coconut oil to breast and the breast pads are sticking to end of nipple and pulls the scabs off when she removes pad. Left nipple is clear. Dr. Ambrose Mantle told RN that he thinks that nipple is excoriated due to pump use and that pump use should be D/C to right side and use hand expression. Mom and I spoke last week about being able to feed  infant fully on one breast since she has is a good milk producer and possibility drying up right side and using left to feed infant as mom has had issues with mastitis to right breast with a previous child. Mom has been unable to latch her infants on right side due to nipple size and pumped that side exclusively. My recommendation is for mom to pump left side every 3 hours for 15 minutes. I advised her to pump right side for comfort every 3 hours to allow for milk supply to down regulate and to prevent recurrent mastitis. With mom's history of over supply and mastitis, discussed with mom it is better to decrease pumping time and then begin dropping pumpings slowly to down regulate production than to stop pumping immediately. Victorino Dike, mom's RN called Dr. Ambrose Mantle and he advised he thinks the pump caused the irritation to the nipple and breast and that he does not want mom to pump right side with a pump and to only hand express right side. It was explained to him that it is believed by this LC that Incorrect flange size has been being used for pumping right side, causing nipple trauma and milk stasis. He again said he does not want her to use a pump on the right side.  I also recommended All Purpose  Nipple Cream for right nipple, Dr. Ambrose MantleHenley did approve of Nipple Cream. [facility] lubrication while pumping. Mom is aware of LC Recommendations and Dr. Ambrose MantleHenley not wanting her to pump with pump on right side. Will need follow up tomorrow and prn. Discussed all with mom's RN, Victorino DikeJennifer.      Maternal Data    Feeding    LATCH Score/Interventions                      Lactation Tools Discussed/Used     Consult Status      Ed BlalockSharon S Hice 10/10/2015, 1:40 PM

## 2015-10-10 NOTE — MAU Note (Signed)
Started on Dicloxacillin 500mg  Q6 hrs starting last Sat. Breast pain worse yesterday. Has blister on R side and when pumps blister bleeds. Feels achy all over and states temp at home 102.5 about 0200. Has not taken any meds this am.

## 2015-10-10 NOTE — Progress Notes (Signed)
Dr. Ambrose MantleHenley notified of pt headache, NO BV b/p 121/73 +BLE. Rates pain 8 out of 10. Percocet 1 tab along with Motrin given with minimal relief. Order given to give Percocet 1-2 tab q4 hrs PRN. Lactation consultant recommended to get a prescription for  all-purpose nipple ointment [facility]. Telephone order given. [facility], husband will pick up. Will continue to monitor.

## 2015-10-10 NOTE — Progress Notes (Signed)
Patient ID: [name] Guerra, female   DOB: [birth year], 34 y.o.   MRN: [id number] Pt admitted for temp 102.9 and probable right breast infection

## 2015-10-10 NOTE — MAU Provider Note (Signed)
History     CSN: [id number]  Arrival date and time: 10/10/15 4098   First Provider Initiated Contact with Patient 10/10/15 [phone]         Chief Complaint  Patient presents with  . Breast Pain  . Fever   HPI  [name] Guerra is a 34 y.o. [id number] who is 11 days postpartum and presents with breast pain & fever.  Was diagnosed with mastitis last week & has been taking dicloxacillin since Monday. States symptoms haven't improved.  Was seen in office yesterday by Dr. Senaida Ores & had temp of 99 then.  At 0230 this morning had episode of very painful pumping. States most of bottle was bloody milk. After pumping had fever and chills. Temp was 99-102. Took no medication.  At this time reports headache with fever, and body aches. Denies abdominal pain, urinary symptoms, n/v/d, chest pain, or sob.    OB History    Gravida Para Term Preterm AB TAB SAB Ectopic Multiple Living   0 5      Past Medical History  Diagnosis Date  . Dysrhythmia   . Gestational diabetes     diet controlled  . Diverticulitis   . Hx of Bell's palsy     as newborn  . SVD (spontaneous vaginal delivery) 05/24/2012    Past Surgical History  Procedure Laterality Date  . Breast cyst excision    . Wisdom tooth extraction    . 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 Anesthesia problems Neg Hx   . Diabetes Mother   . Heart disease Maternal Aunt   . Diabetes Maternal Aunt   . Diabetes Maternal Uncle   . Cancer Maternal Uncle     bone  . Diabetes Maternal Grandmother   . Hypertension Maternal Grandfather   . Heart disease Maternal Grandfather   . Diabetes Maternal Grandfather     Social History  Substance Use Topics  . Smoking status: Never Smoker   . Smokeless tobacco: Never Used  . Alcohol Use: No    Allergies: No Known  Allergies  Prescriptions prior to admission  Medication Sig Dispense Refill Last Dose  . acetaminophen (TYLENOL) 500 MG tablet Take 1,000 mg by mouth every 6 (six) hours as needed for mild pain or headache.   Past Week at Unknown time  . ibuprofen (ADVIL,MOTRIN) 600 MG tablet Take 1 tablet (600 mg total) by mouth every 6 (six) hours. 30 tablet 0   . Prenatal Multivit-Min-Fe-FA (PRENATAL VITAMINS PO) Take 1 each by mouth daily.   09/28/2015 at Unknown time    ROS Physical Exam   Blood pressure 148/88, pulse 132, temperature 102.9 F (39.4 C), temperature source Oral, resp. rate 18, height  (1.753 m), weight 255 lb 12.8 oz (116.03 kg), currently breastfeeding.  Physical Exam  Nursing note and vitals reviewed. Constitutional: She is oriented to person, place, and time. She appears well-developed and well-nourished. No distress.  HENT:  Head: Normocephalic and atraumatic.  Eyes: Conjunctivae are normal. Right eye exhibits no discharge. Left eye exhibits no discharge. No scleral icterus.  Neck: Normal range of motion.  Cardiovascular: Regular rhythm and normal heart sounds.  Tachycardia present.   No murmur heard. Respiratory: Effort normal and breath sounds normal. No respiratory distress. She has no wheezes. Right breast exhibits  tenderness. Left breast exhibits no tenderness.  Right breast hot to touch. Few small abrasions of right nipple. Whole right breast firm & tender. No masses or areas of fluctuation.   GI: Soft.  Neurological: She is alert and oriented to person, place, and time.  Skin: Skin is warm and dry. She is not diaphoretic.  Psychiatric: She has a normal mood and affect. Her behavior is normal. Judgment and thought content normal.    MAU Course  Procedures Results for orders placed or performed during the hospital encounter of 10/10/15 (from the past 24 hour(s))  Urinalysis, Routine w reflex microscopic (not [facility])     Status: Abnormal   Collection Time:  10/10/15  3:45 AM  Result Value Ref Range   Color, Urine YELLOW YELLOW   APPearance CLEAR CLEAR   Specific Gravity, Urine 1.020 1.005 - 1.030   pH 6.0 5.0 - 8.0   Glucose, UA NEGATIVE NEGATIVE mg/dL   Hgb urine dipstick MODERATE (A) NEGATIVE   Bilirubin Urine NEGATIVE NEGATIVE   Ketones, ur NEGATIVE NEGATIVE mg/dL   Protein, ur NEGATIVE NEGATIVE mg/dL   Nitrite NEGATIVE NEGATIVE   Leukocytes, UA NEGATIVE NEGATIVE  Urine microscopic-add on     Status: Abnormal   Collection Time: 10/10/15  3:45 AM  Result Value Ref Range   Squamous Epithelial / LPF 0-5 (A) NONE SEEN   WBC, UA NONE SEEN 0 - 5 WBC/hpf   RBC / HPF 6-30 0 - 5 RBC/hpf   Bacteria, UA RARE (A) NONE SEEN  CBC with Differential     Status: None (Preliminary result)   Collection Time: 10/10/15  4:15 AM  Result Value Ref Range   WBC 6.3 4.0 - 10.5 K/uL   RBC 4.33 3.87 - 5.11 MIL/uL   Hemoglobin 12.0 12.0 - 15.0 g/dL   HCT 98.137.6 19.136.0 - 47.846.0 %   MCV 86.8 78.0 - 100.0 fL   MCH 27.7 26.0 - 34.0 pg   MCHC 31.9 30.0 - 36.0 g/dL   RDW 29.514.7 62.111.5 - 30.815.5 %   Platelets 255 150 - 400 K/uL   Neutrophils Relative % 79 %   Neutro Abs 4.9 1.7 - 7.7 K/uL   Lymphocytes Relative 14 %   Lymphs Abs 0.9 0.7 - 4.0 K/uL   Monocytes Relative 6 %   Monocytes Absolute 0.4 0.1 - 1.0 K/uL   Eosinophils Relative 1 %   Eosinophils Absolute 0.1 0.0 - 0.7 K/uL   Basophils Relative 0 %   Basophils Absolute 0.0 0.0 - 0.1 K/uL   Other PENDING %    MDM 0445- S/w Dr. Ambrose MantleHenley. Notified of patient's presentation, VS, & labs. Will admit to women's unit for obs & IV abx.   Assessment and Plan  A:  1. Acute mastitis of right breast    P: Place in obs bed on Women's Unit  Judeth HornErin Semaja Lymon, NP  10/10/2015, 4:06 AM

## 2015-10-10 NOTE — Lactation Note (Addendum)
Lactation Consultation Note  Patient Name: [name] Guerra MWNUU'VToday's Date: 10/10/2015   Mom reported that Dr told her not to pump or empty affected breast. Due to difference of opinion between OB and LC, in relation to treatment/emptying of breast with Mastitis, Printed [employer] Protocol for treatment of Mastitis and gave to patient's RN and a copy to patient. Printed [name] EdwardsJack Newman's All Purpose Nipple Guerra handout and gave to patient's RN and to patient. Mom reports that the pump in style she has at home had a very forceful suction, even on the lowest setting compared to the Symphony at the hospital. [facility].     Maternal Data    Feeding    LATCH Score/Interventions                      Lactation Tools Discussed/Used     Consult Status      [name] BlalockSharon S Alesa Guerra 10/10/2015, 3:29 PM

## 2015-10-10 NOTE — H&P (Signed)
[name], [name]              ACCOUNT NO.:  [id number]  MEDICAL RECORD NO.:  [id number]  LOCATION:  9306                          FACILITY:  [facility]  PHYSICIAN:  Malachi Prohomas F. Ambrose MantleHenley, M.D. DATE OF BIRTH:  [birth year]  DATE OF ADMISSION:  10/10/2015 DATE OF DISCHARGE:                             HISTORY & PHYSICAL   HISTORY OF PRESENT ILLNESS:  This is a 34 year old black female, para 6- 0-2-6, who is 11 days postpartum and admitted with presumed right mastitis.  This patient was followed in our office and had a 1 hour Glucola of 216.  She was seen in Nutrition and Diabetes Management.  She was started on glyburide 1.25 mg every morning, subsequently increased to glyburide 1.25 mg twice a day.  On that regimen, her fasting blood sugars were 93-100 and 1 hour of breakfast 130-296, lunch 150-373, and dinner 160-208.  The glyburide was increased to 2.5 mg in the mornings, but the patient did not go up on the glyburide as advised.  On September 15, 2015, the fastings were 85-110, 1 hour after breakfast 128-140, lunch 115-154, and thinner 90-160.  She continued on that day's dose until she was admitted for delivery.  The patient delivered 11 days ago, went home 2 days later, and 4 days postpartum she noted the onset of pain in the lateral aspect of her right breast.  Her temperature elevation was to 99+.  She called Dr.  Jackelyn KnifeMeisinger and was placed on dicloxacillin to take every 6 hours.  The patient complied with that regimen, but noticed 1 week ago when she started the dicloxacillin that she had blisters on her right nipple.  They were thought to be secondary to the breast.  Over the last week, she has had a relatively uncomplicated course, although her right breast blisters on her nipple and areola turned to the areas where the skin had eroded and she noted blood in the breast.  When she awoke this morning at 2:00 a.m., she was having chills, fever, feeling like she had the flu and noticed  extreme discomfort in the right breast.  She came to the hospital after her temperature was recorded home as 102.8, came here and her temperature was a 102.9.  She was considered a failure of dicloxacillin and was admitted for additional management.  PAST MEDICAL HISTORY:  Reveals elevated blood pressure and gestational diabetes.  PAST SURGICAL HISTORY:  No surgeries.  ALLERGIES:  No known drug allergies.  No latex or food allergies.  SOCIAL HISTORY:  The patient never smoked, does not drink, and does not use illicit drugs.  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  PHYSICAL EXAMINATION:  VITAL SIGNS:  On admission, temperature 102.9 on admission, now is 100.4, pulse is 104, respirations 18, and blood pressure 148/83. HEART:  Normal size and sounds.  No murmurs. LUNGS:  Clear to auscultation. BREAST:  The left breast is full but is not tender and there are no abnormalities, except fullness from breast milk.  The right breast is very tender on the medial aspect, but there is no definable redness or sign of abscess formation.  There is excoriation of the skin on the right  areola. ABDOMEN:  Soft and nontender. PELVIC:  Exam is not done.  ADMITTING IMPRESSION:  Right mastitis, with excoriation of the skin of the areola status post treatment with dicloxacillin.  The patient is admitted for IV antibiotics and local treatment of the breast.  I will do an ultrasound of the right breast to make sure there is no abscess formation and culture the skin of the right areola for routine culture and herpes.     Malachi Pro. Ambrose Mantle, M.D.     TFH/MEDQ  D:  10/10/2015  T:  10/10/2015  Job:  [id number]

## 2015-10-11 LAB — GLUCOSE, CAPILLARY
GLUCOSE-CAPILLARY: 145 mg/dL — AB (ref 65–99)
Glucose-Capillary: 135 mg/dL — ABNORMAL HIGH (ref 65–99)
Glucose-Capillary: 122 mg/dL — ABNORMAL HIGH (ref 65–99)

## 2015-10-11 MED ORDER — LACTATED RINGERS IV SOLN: INTRAVENOUS | Status: DC

## 2015-10-11 NOTE — Progress Notes (Signed)
Patient ID: [name] Guerra, female   DOB: 06/06/1981, 34 y.o.   MRN: [id number] Afebrile since admission. Pt states breast feels better. US showed no abscess. Will continue IV kefzol and if no fever d/c tomorrow.

## 2015-10-11 NOTE — Lactation Note (Signed)
Lactation Consultation Note  Patient is here for mastitis on her right breast and it is also engorged.  She had not been expressing on that side per MD recommendation but initiated it today.  The milk is not flowing easily. She has open areas on the nipple and it is being aggravated by the #30 flange so the IBCLC increased the flange size to a # 36. Mother reported increased comfort.  Even with the increase in flange size the breast did not drain.  She was placed in a supine position to facilitate drainage of the lymph tissue.  This position coupled with hand expression helped the breast to soften somewhat. We also pumped with her laying flat and though she did not successfully collect the millk related to position she was able to express milk.  Mother reported increased comfort.  Encouraged her to continue working on this side every couple of hours.  She may employ any comfort measure she desires such as heat and ice.  Follow-up tomorrow  Patient Name: [name] Guerra ZOXWR'UToday's Date: 10/11/2015     Maternal Data    Feeding    LATCH Score/Interventions                      Lactation Tools Discussed/Used     Consult Status      Soyla DryerJoseph, Nickolaos Brallier 10/11/2015, 3:26 PM

## 2015-10-12 LAB — HERPES SIMPLEX VIRUS CULTURE: CULTURE: NOT DETECTED

## 2015-10-12 MED ORDER — CEPHALEXIN 500 MG PO CAPS: 500.0000 mg | ORAL_CAPSULE | Freq: Four times a day (QID) | ORAL | Status: DC

## 2015-10-12 MED ORDER — IBUPROFEN 800 MG PO TABS
800.0000 mg | ORAL_TABLET | Freq: Three times a day (TID) | ORAL | Status: DC | PRN
  Administered 2015-10-12: 800 mg via ORAL
  Filled 2015-10-12: qty 1

## 2015-10-12 MED ORDER — CEPHALEXIN 500 MG PO CAPS
500.0000 mg | ORAL_CAPSULE | Freq: Four times a day (QID) | ORAL | Status: DC
  Filled 2015-10-12 (×4): qty 1

## 2015-10-12 MED ORDER — CEFAZOLIN SODIUM-DEXTROSE 2-3 GM-% IV SOLR
2.0000 g | Freq: Once | INTRAVENOUS | Status: AC
Start: 2015-10-12 — End: 2015-10-12
  Administered 2015-10-12: 2 g via INTRAVENOUS
  Filled 2015-10-12: qty 50

## 2015-10-12 NOTE — Consult Note (Signed)
Lactation note: Follow up visit made.  Patient is discharged home on antibiotics.  She feels breast and nipple tissue have improved greatly. Right breast is slightly swollen but soft to palpation.  Nipple is scabbed but healing.  She was able to pump 120 mls last night and 40 mls this AM.  Patient states discomfort is much improved since milk has been released.  Lactation services and support information reviewed and encouraged.

## 2015-10-12 NOTE — Progress Notes (Signed)
Patient discharged home with significant other.. Discharge instructions reviewed with patient and she verbalized understanding... Condition stable... No equipment... Ambulated to car with J. Bass, NT. 

## 2015-10-12 NOTE — Progress Notes (Signed)
Follow up visit with family who is known to this chaplain from their brief stay in the NICU.  [name] is doing well, but [name] Guerra is suffering from mastitis.  Chaplain provided pt and her husband an opportunity to process their feelings from teh difficult [facility] and time in [facility]ICU then [name] Guerra's subsequent hospitalization.  We discussed that despite the family having had 5 other daughters, each baby is different and teaches [name] Guerra new things.  [name] Guerra shared that he was in [place] work for a month before [name] Guerra went into labor and that she kept having issues so he was concerned that she would deliver early.  [name] Guerra stated that he has to return to work on Friday, but only has to work 3 days this month.  [name] Guerra shared that her 34 year old daughter really enjoys holding her baby sister and chaplain encouraged [name] Guerra to pull on her resources for extra support once [name] Guerra returns to work. [name] Guerra is encouraged that she is allowed to return home this afternoon.  Chaplain encouraged her to call if she is in need of more support once she returns home.    Please page as further needs arise.  [name] ShapeAmanda M. Carley Hammedavee Guerra, M.Div. [facility] Chaplain Pager [phone] Office [phone]     10/12/15 1300  Clinical Encounter Type  Visited With Patient and family together  Visit Type Follow-up;Social support  Referral From Chaplain  Stress Factors  Patient Stress Factors Health changes

## 2015-10-12 NOTE — Progress Notes (Signed)
Patient ID: [name] Guerra, female   DOB: Oct 03, 1981, 34 y.o.   MRN: [id number] Pt has remained afebrile since the temp was 102.9 on admission. The right breast is less tender and the nipple appears to be healing. Will d/c on keflex.

## 2015-10-12 NOTE — Discharge Instructions (Signed)
Call with recurrent fever. May resume breast feeding on right breast after nipple has healed.

## 2015-10-12 NOTE — Discharge Summary (Signed)
[name], [name]              ACCOUNT NO.:  [id number]  MEDICAL RECORD NO.:  [id number]  LOCATION:  9306                          [facility]ILITY:  [facility]  PHYSICIAN:  Malachi Prohomas F. Ambrose MantleHenley, M.D. DATE OF BIRTH:  06-19-1981  DATE OF ADMISSION:  10/10/2015 DATE OF DISCHARGE:  10/12/2015                              DISCHARGE SUMMARY   HOSPITAL COURSE:  A 34 year old black female, para 6-0-2-6, 11 days postpartum, admitted with presumed right mastitis.  She had taken a week of dicloxacillin.  Temperature on admission was 102.9.  She was admitted, placed on IV Kefzol, remained afebrile for the duration of her stay of 2 days.  The right nipple was traumatized and was treated locally, and the breast was treated with warm compresses in addition to the IV Kefzol.  At the time of discharge, the right nipple appears to be healing.  The breast is not as tender and ultrasound showed no mass or fluid suggesting an abscess and she is ready for discharge.  FINAL DIAGNOSIS:  Acute mastitis right breast, 11 days postpartum, failed treatment with dicloxacillin.  FINAL CONDITION:  Improved.  INSTRUCTIONS:  Include our regular discharge instructions as well as Keflex 500 mg by mouth every 6 hours for 7 days.  The patient is advised that she may resume breastfeeding on the right breast as soon as the nipple has healed.  She is to return for her 6 weeks postpartum checkup. She is to call with any recurrent fever.  Hemoglobin on admission was 12.0, hematocrit 37.6, white count 6300, platelet count 255,000. Comprehensive metabolic profile was normal except for slightly low calcium and albumin.  Call with problems.     Malachi Prohomas F. Ambrose MantleHenley, M.D.     TFH/MEDQ  D:  10/12/2015  T:  10/12/2015  Job:  [id number]

## 2015-10-15 LAB — CULTURE, BLOOD (SINGLE): Culture: NO GROWTH

## 2015-10-15 LAB — CULTURE, BLOOD (ROUTINE X 2): Culture: NO GROWTH

## 2015-10-15 LAB — WOUND CULTURE: Gram Stain: NONE SEEN

## 2016-01-06 ENCOUNTER — Encounter (HOSPITAL_COMMUNITY): Payer: Self-pay | Admitting: *Deleted

## 2016-01-14 NOTE — H&P (Signed)
[name] Guerra is an 35 y.o. female [phone] who presents for a laparoscopic tubal sterilization.  She delivered 12/16 and has decided she does not want any further children.    Pertinent Gynecological History:  OB History:  NSVD x 6   Menstrual History:  No LMP recorded.    Past Medical History  Diagnosis Date  . Dysrhythmia   . Gestational diabetes     diet controlled  . Diverticulitis   . Hx of Bell's palsy     as newborn  . SVD (spontaneous vaginal delivery) 05/24/2012  . Lymphadenitis     LEFT LOWER LEG  . Depression     POST PARTUM    Past Surgical History  Procedure Laterality Date  . Breast cyst excision    . Wisdom tooth extraction    . 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 Anesthesia problems Neg Hx   . Diabetes Mother   . Heart disease Maternal Aunt   . Diabetes Maternal Aunt   . Diabetes Maternal Uncle   . Cancer Maternal Uncle     bone  . Diabetes Maternal Grandmother   . Hypertension Maternal Grandfather   . Heart disease Maternal Grandfather   . Diabetes Maternal Grandfather     Social History:  reports that she has never smoked. She has never used smokeless tobacco. She reports that she does not drink alcohol or use illicit drugs.  Allergies: No Known Allergies  No prescriptions prior to admission    Review of Systems  Gastrointestinal: Negative for abdominal pain.    Height 5\' 9"  (1.753 m), weight 104.327 kg (230 lb), currently breastfeeding. Physical Exam  Constitutional: She is oriented to person, place, and time. She appears well-developed.  Cardiovascular: Normal rate and regular rhythm.   Respiratory: Effort normal.  GI: Soft.  Genitourinary: Vagina normal and uterus normal.  Neurological: She is alert and oriented to person, place, and time.  Psychiatric: She has a normal mood and  affect.    No results found for this or any previous visit (from the past 24 hour(s)).  No results found.  Assessment/Plan: d/Guerra pt her laparoscopic tubal fulguration in detail. d/Guerra her risks and benefits including bleeding, infection, and possible damage to bowel and bladder. We discussed that should a complication arise she might need a larger incision and have a much longer recovery. We discussed the risk of failure of 1/100 with pregnancy occuring and an increased risk of ectopic pregnancy should she conceive after the surgery. Pt desires to proceed. [name] PilaICHARDSON,[name] Guerra 01/14/2016, 10:18 AM

## 2016-01-15 ENCOUNTER — Ambulatory Visit (HOSPITAL_COMMUNITY): Payer: 59 | Admitting: Anesthesiology

## 2016-01-15 ENCOUNTER — Encounter (HOSPITAL_COMMUNITY)
Admission: RE | Disposition: A | Discharge status: Home / Self Care | Payer: Self-pay | Source: Ambulatory Visit | Attending: Obstetrics and Gynecology

## 2016-01-15 ENCOUNTER — Ambulatory Visit (HOSPITAL_COMMUNITY)
Admission: RE | Admit: 2016-01-15 | Discharge: 2016-01-15 | Disposition: A | Discharge status: Home / Self Care | Payer: 59 | Source: Ambulatory Visit | Attending: Obstetrics and Gynecology | Admitting: Obstetrics and Gynecology

## 2016-01-15 DIAGNOSIS — I889 Nonspecific lymphadenitis, unspecified: Secondary | ICD-10-CM | POA: Insufficient documentation

## 2016-01-15 DIAGNOSIS — Z6834 Body mass index (BMI) 34.0-34.9, adult: Secondary | ICD-10-CM | POA: Insufficient documentation

## 2016-01-15 DIAGNOSIS — Z302 Encounter for sterilization: Secondary | ICD-10-CM | POA: Diagnosis not present

## 2016-01-15 HISTORY — DX: Nonspecific lymphadenitis, unspecified: I88.9

## 2016-01-15 HISTORY — PX: LAPAROSCOPIC TUBAL LIGATION: SHX1937

## 2016-01-15 HISTORY — DX: Depression, unspecified: F32.A

## 2016-01-15 HISTORY — DX: Major depressive disorder, single episode, unspecified: F32.9

## 2016-01-15 LAB — CBC
HEMATOCRIT: 37.5 % (ref 36.0–46.0)
HEMOGLOBIN: 12.7 g/dL (ref 12.0–15.0)
MCH: 28.3 pg (ref 26.0–34.0)
MCHC: 33.9 g/dL (ref 30.0–36.0)
MCV: 83.7 fL (ref 78.0–100.0)
Platelets: 214 10*3/uL (ref 150–400)
RBC: 4.48 MIL/uL (ref 3.87–5.11)
RDW: 14.7 % (ref 11.5–15.5)
WBC: 6.3 10*3/uL (ref 4.0–10.5)

## 2016-01-15 LAB — PREGNANCY, URINE: Preg Test, Ur: NEGATIVE

## 2016-01-15 SURGERY — LIGATION, FALLOPIAN TUBE, LAPAROSCOPIC
Anesthesia: General | Site: Abdomen | Laterality: Bilateral

## 2016-01-15 MED ORDER — SODIUM CHLORIDE 0.9 % IJ SOLN
INTRAMUSCULAR | Status: AC
  Filled 2016-01-15: qty 10

## 2016-01-15 MED ORDER — NEOSTIGMINE METHYLSULFATE 10 MG/10ML IV SOLN
INTRAVENOUS | Status: AC
  Filled 2016-01-15: qty 1

## 2016-01-15 MED ORDER — NEOSTIGMINE METHYLSULFATE 10 MG/10ML IV SOLN
INTRAVENOUS | Status: DC | PRN
  Administered 2016-01-15: 3 mg via INTRAVENOUS

## 2016-01-15 MED ORDER — DEXAMETHASONE SODIUM PHOSPHATE 4 MG/ML IJ SOLN
INTRAMUSCULAR | Status: AC
  Filled 2016-01-15: qty 1

## 2016-01-15 MED ORDER — GLYCOPYRROLATE 0.2 MG/ML IJ SOLN
INTRAMUSCULAR | Status: DC | PRN
  Administered 2016-01-15: 0.4 mg via INTRAVENOUS

## 2016-01-15 MED ORDER — SCOPOLAMINE 1 MG/3DAYS TD PT72
MEDICATED_PATCH | TRANSDERMAL | Status: AC
  Administered 2016-01-15: 1.5 mg via TRANSDERMAL
  Filled 2016-01-15: qty 1

## 2016-01-15 MED ORDER — LIDOCAINE HCL (CARDIAC) 20 MG/ML IV SOLN
INTRAVENOUS | Status: DC | PRN
  Administered 2016-01-15: 100 mg via INTRAVENOUS

## 2016-01-15 MED ORDER — DEXAMETHASONE SODIUM PHOSPHATE 4 MG/ML IJ SOLN
INTRAMUSCULAR | Status: DC | PRN
  Administered 2016-01-15: 4 mg via INTRAVENOUS

## 2016-01-15 MED ORDER — MIDAZOLAM HCL 2 MG/2ML IJ SOLN
INTRAMUSCULAR | Status: AC
  Filled 2016-01-15: qty 2

## 2016-01-15 MED ORDER — SODIUM CHLORIDE 0.9 % IJ SOLN
INTRAMUSCULAR | Status: DC | PRN
  Administered 2016-01-15: 10 mL

## 2016-01-15 MED ORDER — LIDOCAINE HCL (CARDIAC) 20 MG/ML IV SOLN
INTRAVENOUS | Status: AC
Start: 2016-01-15 — End: 2016-01-15
  Filled 2016-01-15: qty 5

## 2016-01-15 MED ORDER — ONDANSETRON HCL 4 MG/2ML IJ SOLN
INTRAMUSCULAR | Status: DC | PRN
  Administered 2016-01-15: 4 mg via INTRAVENOUS

## 2016-01-15 MED ORDER — ACETAMINOPHEN 160 MG/5ML PO SOLN: 325.0000 mg | ORAL | Status: DC | PRN

## 2016-01-15 MED ORDER — KETOROLAC TROMETHAMINE 30 MG/ML IJ SOLN
INTRAMUSCULAR | Status: DC | PRN
  Administered 2016-01-15: 30 mg via INTRAVENOUS

## 2016-01-15 MED ORDER — PROMETHAZINE HCL 25 MG/ML IJ SOLN: 6.2500 mg | INTRAMUSCULAR | Status: DC | PRN

## 2016-01-15 MED ORDER — GLYCOPYRROLATE 0.2 MG/ML IJ SOLN
INTRAMUSCULAR | Status: AC
  Filled 2016-01-15: qty 3

## 2016-01-15 MED ORDER — OXYCODONE HCL 5 MG PO TABS
ORAL_TABLET | ORAL | Status: AC
  Filled 2016-01-15: qty 1

## 2016-01-15 MED ORDER — MIDAZOLAM HCL 5 MG/5ML IJ SOLN
INTRAMUSCULAR | Status: DC | PRN
  Administered 2016-01-15: 2 mg via INTRAVENOUS

## 2016-01-15 MED ORDER — HYDROCODONE-ACETAMINOPHEN 5-325 MG PO TABS: 1.0000 | ORAL_TABLET | Freq: Four times a day (QID) | ORAL | Status: DC | PRN

## 2016-01-15 MED ORDER — BUPIVACAINE HCL (PF) 0.25 % IJ SOLN
INTRAMUSCULAR | Status: AC
  Filled 2016-01-15: qty 30

## 2016-01-15 MED ORDER — OXYCODONE HCL 5 MG PO TABS
5.0000 mg | ORAL_TABLET | Freq: Once | ORAL | Status: AC | PRN
  Administered 2016-01-15: 5 mg via ORAL

## 2016-01-15 MED ORDER — OXYCODONE HCL 5 MG/5ML PO SOLN: 5.0000 mg | Freq: Once | ORAL | Status: AC | PRN

## 2016-01-15 MED ORDER — BUPIVACAINE HCL (PF) 0.25 % IJ SOLN
INTRAMUSCULAR | Status: DC | PRN
  Administered 2016-01-15: 8 mL

## 2016-01-15 MED ORDER — ACETAMINOPHEN 325 MG PO TABS: 325.0000 mg | ORAL_TABLET | ORAL | Status: DC | PRN

## 2016-01-15 MED ORDER — PROPOFOL 10 MG/ML IV BOLUS
INTRAVENOUS | Status: DC | PRN
  Administered 2016-01-15: 200 mg via INTRAVENOUS

## 2016-01-15 MED ORDER — FENTANYL CITRATE (PF) 100 MCG/2ML IJ SOLN
INTRAMUSCULAR | Status: DC | PRN
  Administered 2016-01-15: 50 ug via INTRAVENOUS
  Administered 2016-01-15: 150 ug via INTRAVENOUS
  Administered 2016-01-15: 50 ug via INTRAVENOUS

## 2016-01-15 MED ORDER — ONDANSETRON HCL 4 MG/2ML IJ SOLN
INTRAMUSCULAR | Status: AC
  Filled 2016-01-15: qty 2

## 2016-01-15 MED ORDER — SUCCINYLCHOLINE CHLORIDE 20 MG/ML IJ SOLN
INTRAMUSCULAR | Status: DC | PRN
  Administered 2016-01-15: 120 mg via INTRAVENOUS

## 2016-01-15 MED ORDER — FENTANYL CITRATE (PF) 100 MCG/2ML IJ SOLN: 25.0000 ug | INTRAMUSCULAR | Status: DC | PRN

## 2016-01-15 MED ORDER — SCOPOLAMINE 1 MG/3DAYS TD PT72
1.0000 | MEDICATED_PATCH | Freq: Once | TRANSDERMAL | Status: DC
  Administered 2016-01-15: 1.5 mg via TRANSDERMAL

## 2016-01-15 MED ORDER — FENTANYL CITRATE (PF) 250 MCG/5ML IJ SOLN
INTRAMUSCULAR | Status: AC
  Filled 2016-01-15: qty 5

## 2016-01-15 MED ORDER — ROCURONIUM BROMIDE 100 MG/10ML IV SOLN
INTRAVENOUS | Status: AC
  Filled 2016-01-15: qty 1

## 2016-01-15 MED ORDER — LACTATED RINGERS IV SOLN
INTRAVENOUS | Status: DC
  Administered 2016-01-15: 10:00:00 via INTRAVENOUS

## 2016-01-15 MED ORDER — SUCCINYLCHOLINE CHLORIDE 20 MG/ML IJ SOLN
INTRAMUSCULAR | Status: AC
  Filled 2016-01-15: qty 1

## 2016-01-15 MED ORDER — LACTATED RINGERS IV SOLN: INTRAVENOUS | Status: DC

## 2016-01-15 MED ORDER — KETOROLAC TROMETHAMINE 30 MG/ML IJ SOLN
INTRAMUSCULAR | Status: AC
  Filled 2016-01-15: qty 1

## 2016-01-15 MED ORDER — PROPOFOL 10 MG/ML IV BOLUS
INTRAVENOUS | Status: AC
Start: 2016-01-15 — End: 2016-01-15
  Filled 2016-01-15: qty 20

## 2016-01-15 MED ORDER — ROCURONIUM BROMIDE 100 MG/10ML IV SOLN
INTRAVENOUS | Status: DC | PRN
  Administered 2016-01-15: 20 mg via INTRAVENOUS

## 2016-01-15 SURGICAL SUPPLY — 23 items
CATH ROBINSON RED A/P 16FR (CATHETERS) ×3 IMPLANT
CHLORAPREP W/TINT 26ML (MISCELLANEOUS) ×3 IMPLANT
CLOTH BEACON ORANGE TIMEOUT ST (SAFETY) ×3 IMPLANT
DRSG COVADERM PLUS 2X2 (GAUZE/BANDAGES/DRESSINGS) ×6 IMPLANT
DRSG OPSITE POSTOP 3X4 (GAUZE/BANDAGES/DRESSINGS) ×6 IMPLANT
GLOVE BIO SURGEON STRL SZ 6.5 (GLOVE) ×2 IMPLANT
GLOVE BIO SURGEONS STRL SZ 6.5 (GLOVE) ×1
GLOVE BIOGEL PI IND STRL 7.0 (GLOVE) ×1 IMPLANT
GLOVE BIOGEL PI INDICATOR 7.0 (GLOVE) ×2
GOWN STRL REUS W/TWL LRG LVL3 (GOWN DISPOSABLE) ×6 IMPLANT
LIQUID BAND (GAUZE/BANDAGES/DRESSINGS) ×3 IMPLANT
NEEDLE INSUFFLATION 120MM (ENDOMECHANICALS) ×3 IMPLANT
PACK LAPAROSCOPY BASIN (CUSTOM PROCEDURE TRAY) ×3 IMPLANT
PAD TRENDELENBURG POSITION (MISCELLANEOUS) ×3 IMPLANT
SLEEVE XCEL OPT CAN 5 100 (ENDOMECHANICALS) ×3 IMPLANT
SUT VIC AB 3-0 PS2 18 (SUTURE) ×2
SUT VIC AB 3-0 PS2 18XBRD (SUTURE) ×1 IMPLANT
SUT VICRYL 0 UR6 27IN ABS (SUTURE) ×3 IMPLANT
TOWEL OR 17X24 6PK STRL BLUE (TOWEL DISPOSABLE) ×6 IMPLANT
TROCAR XCEL NON-BLD 11X100MML (ENDOMECHANICALS) ×3 IMPLANT
TROCAR XCEL NON-BLD 5MMX100MML (ENDOMECHANICALS) ×3 IMPLANT
WARMER LAPAROSCOPE (MISCELLANEOUS) ×3 IMPLANT
WATER STERILE IRR 1000ML POUR (IV SOLUTION) ×3 IMPLANT

## 2016-01-15 NOTE — Progress Notes (Signed)
Patient ID: [name] Guerra, female   DOB: May 27, 1981, 35 y.o.   MRN: [id number] Per pt no changes in dictated H&P.  Brief exam WNL.  She has been taking micronor daily and breastfeeding.   No menses yet.

## 2016-01-15 NOTE — Op Note (Signed)
Operative Note    Preoperative Diagnosis Desires Sterilization  Postoperative Diagnosis same  Procedure Laparoscopic Tubal fulguration  Surgeon Huel CoteKathy Gino Garrabrant  Anesthesia GETA  Fluids: EBL <50cc UOP 150cc clear IVF 1100cc LR  Findings Normal uterus, tubes and ovaries Liver edge WNL  Specimen None  Procedure Note  Patient was taken to the operating room where general anesthesia was obtained without difficulty. She was then prepped and draped in the normal sterile fashion in the dorsal lithotomy position. An appropriate timeout was performed. A speculum was then placed within the vagina and a Hulka tenaculum placed within the cervix for uterine manipulation. The bladder was emptied. Attention was then turned to the patient's abdomen after draping where the infraumbilical area was injected with approximately 10 cc of quarter percent Marcaine. A 1 cm incision was then made within the umbilicus and the varies needle easily introduced into the peritoneal cavity. Intraperitoneal placement was confirmed by aspiration and injection with normal saline. Gas flow was then applied and a pneumoperitoneum obtained with approximate 3 L of CO2 gas. The varies needle was then removed and the 11 mm trocar was easily introduced into the abdomen. With patient in Trendelenburg the uterus and tubes and ovaries were inspected with findings as previously stated. A bipolar cautery was then introduced through the operative scope and the left fallopian tube grasped approximately 3 cm from the uterine cornua and cauterized in 2 sequential areas with good blanching noted. Attention was then turned to the right fallopian tube which was likewise grasped proximally 3 cm from the uterine cornua and cauterized into sequential spots with good blanching noted there as well. The remainder of the pelvis and abdomen were inspected and found to be normal. The instruments were removed from the abdomen and the  pneumoperitoneum reduced through the trocar. The trocar was finally removed and the infraumbilical incision closed with one deep stitch of 0 Vicryl and a subcuticular stitch of 3-0 Vicryl. Liquiband was placed. Patient was then awakened and taken to the recovery room in good condition.

## 2016-01-15 NOTE — Discharge Instructions (Addendum)
DISCHARGE INSTRUCTIONS: Laparoscopy  The following instructions have been prepared to help you care for yourself upon your return home today.  Wound care:  Do not get the incision wet for the first 24 hours. The incision should be kept clean and dry.  The Band-Aids or dressings may be removed the day after surgery.  Should the incision become sore, red, and swollen after the first week, check with your doctor.  Personal hygiene:  Shower the day after your procedure.  Activity and limitations:  Do NOT drive or operate any equipment today.  Do NOT lift anything more than 15 pounds for 2-3 weeks after surgery.  Do NOT rest in bed all day.  Walking is encouraged. Walk each day, starting slowly with 5-minute walks 3 or 4 times a day. Slowly increase the length of your walks.  Walk up and down stairs slowly.  Do NOT do strenuous activities, such as golfing, playing tennis, bowling, running, biking, weight lifting, gardening, mowing, or vacuuming for 2-4 weeks. Ask your doctor when it is okay to start.  Diet: Eat a light meal as desired this evening. You may resume your usual diet tomorrow.  Return to work: This is dependent on the type of work you do. For the most part you can return to a desk job within a week of surgery. If you are more active at work, please discuss this with your doctor.  What to expect after your surgery: You may have a slight burning sensation when you urinate on the first day. You may have a very small amount of blood in the urine. Expect to have a small amount of vaginal discharge/light bleeding for 1-2 weeks. It is not unusual to have abdominal soreness and bruising for up to 2 weeks. You may be tired and need more rest for about 1 week. You may experience shoulder pain for 24-72 hours. Lying flat in bed may relieve it.  Call your doctor for any of the following:  Develop a fever of 100.4 or greater  Inability to urinate 6 hours after discharge from  hospital  Severe pain not relieved by pain medications  Persistent of heavy bleeding at incision site  Redness or swelling around incision site after a week  Increasing nausea or vomiting  Patient Signature________________________________________ Nurse Signature_________________________________________ Post Anesthesia Home Care Instructions  No Ibuprofen products until 5:00 today  Activity: Get plenty of rest for the remainder of the day. A responsible adult should stay with you for 24 hours following the procedure.  For the next 24 hours, DO NOT: -Drive a car -Advertising copywriterperate machinery -Drink alcoholic beverages -Take any medication unless instructed by your physician -Make any legal decisions or sign important papers.  Meals: Start with liquid foods such as gelatin or soup. Progress to regular foods as tolerated. Avoid greasy, spicy, heavy foods. If nausea and/or vomiting occur, drink only clear liquids until the nausea and/or vomiting subsides. Call your physician if vomiting continues.  Special Instructions/Symptoms: Your throat may feel dry or sore from the anesthesia or the breathing tube placed in your throat during surgery. If this causes discomfort, gargle with warm salt water. The discomfort should disappear within 24 hours.  If you had a scopolamine patch placed behind your ear for the management of post- operative nausea and/or vomiting:  1. The medication in the patch is effective for 72 hours, after which it should be removed.  Wrap patch in a tissue and discard in the trash. Wash hands thoroughly with soap and  include dry mouth, dizziness or visual disturbances. 3. Avoid touching the patch. Wash your hands with soap and water after contact with the patch.      

## 2016-01-15 NOTE — Anesthesia Preprocedure Evaluation (Signed)
Anesthesia Evaluation  Patient identified by MRN, date of birth, ID band Patient awake    Reviewed: Allergy & Precautions, NPO status , Patient's Chart, lab work & pertinent test results  History of Anesthesia Complications Negative for: history of anesthetic complications  Airway Mallampati: II  TM Distance: >3 FB Neck ROM: Full    Dental  (+) Teeth Intact   Pulmonary neg shortness of breath, neg sleep apnea, neg COPD, neg recent URI,    breath sounds clear to auscultation       Cardiovascular (-) hypertension(-) CHF (-) dysrhythmias  Rhythm:Regular     Neuro/Psych Depression    GI/Hepatic negative GI ROS, Neg liver ROS,   Endo/Other  Morbid obesity  Renal/GU negative Renal ROS     Musculoskeletal   Abdominal   Peds  Hematology negative hematology ROS (+)   Anesthesia Other Findings   Reproductive/Obstetrics                             Anesthesia Physical Anesthesia Plan  ASA: II  Anesthesia Plan: General   Post-op Pain Management:    Induction: Intravenous  Airway Management Planned: Oral ETT  Additional Equipment: None  Intra-op Plan:   Post-operative Plan: Extubation in OR  Informed Consent: I have reviewed the patients History and Physical, chart, labs and discussed the procedure including the risks, benefits and alternatives for the proposed anesthesia with the patient or authorized representative who has indicated his/her understanding and acceptance.   Dental advisory given  Plan Discussed with: CRNA and Surgeon  Anesthesia Plan Comments:         Anesthesia Quick Evaluation

## 2016-01-15 NOTE — Anesthesia Procedure Notes (Signed)
Procedure Name: Intubation Date/Time: 01/15/2016 10:57 AM Performed by: Jhonnie GarnerMARSHALL, Haniyyah Sakuma M Pre-anesthesia Checklist: Patient identified, Emergency Drugs available, Suction available and Patient being monitored Patient Re-evaluated:Patient Re-evaluated prior to inductionOxygen Delivery Method: Circle system utilized Preoxygenation: Pre-oxygenation with 100% oxygen Intubation Type: IV induction Ventilation: Mask ventilation without difficulty Laryngoscope Size: Mac and 3 Grade View: Grade I Tube type: Oral Tube size: 7.0 mm Number of attempts: 1 (attempt to visualize with Hyacinth MeekerMiller 3 without success.) Airway Equipment and Method: Stylet Placement Confirmation: ETT inserted through vocal cords under direct vision,  positive ETCO2 and breath sounds checked- equal and bilateral Secured at: 21 cm Tube secured with: Tape Dental Injury: Teeth and Oropharynx as per pre-operative assessment

## 2016-01-15 NOTE — Anesthesia Postprocedure Evaluation (Signed)
Anesthesia Post Note  Patient: [name] Guerra  Procedure(s) Performed: Procedure(s) (LRB): LAPAROSCOPIC TUBAL LIGATION (Bilateral)  Patient location during evaluation: PACU Anesthesia Type: General Level of consciousness: awake Pain management: pain level controlled Vital Signs Assessment: post-procedure vital signs reviewed and stable Respiratory status: spontaneous breathing and respiratory function stable Cardiovascular status: stable Postop Assessment: no signs of nausea or vomiting Anesthetic complications: no    Last Vitals:  Filed Vitals:   01/15/16 1230 01/15/16 1245  BP: 132/88 134/95  Pulse: 63 64  Temp:  36.7 C  Resp: 20 19    Last Pain:  Filed Vitals:   01/15/16 1254  PainSc: 0-No pain                 Jakobee Brackins

## 2016-01-15 NOTE — Transfer of Care (Signed)
Immediate Anesthesia Transfer of Care Note  Patient: [name] Guerra  Procedure(s) Performed: Procedure(s): LAPAROSCOPIC TUBAL LIGATION (Bilateral)  Patient Location: PACU  Anesthesia Type:General  Level of Consciousness: awake, alert  and oriented  Airway & Oxygen Therapy: Patient Spontanous Breathing and Patient connected to nasal cannula oxygen  Post-op Assessment: Report given to RN  Post vital signs: Reviewed  Last Vitals:  Filed Vitals:   01/15/16 1007 01/15/16 1017  BP:  128/91  Pulse: 75   Temp: 36.5 C   Resp: 20     Complications: No apparent anesthesia complications

## 2016-01-18 ENCOUNTER — Encounter (HOSPITAL_COMMUNITY): Payer: Self-pay | Admitting: Obstetrics and Gynecology

## 2016-04-01 ENCOUNTER — Emergency Department (HOSPITAL_BASED_OUTPATIENT_CLINIC_OR_DEPARTMENT_OTHER)
Admission: EM | Admit: 2016-04-01 | Discharge: 2016-04-01 | Disposition: A | Discharge status: Home / Self Care | Payer: 59 | Attending: Emergency Medicine | Admitting: Emergency Medicine

## 2016-04-01 ENCOUNTER — Encounter (HOSPITAL_BASED_OUTPATIENT_CLINIC_OR_DEPARTMENT_OTHER): Payer: Self-pay | Admitting: Emergency Medicine

## 2016-04-01 ENCOUNTER — Emergency Department (HOSPITAL_BASED_OUTPATIENT_CLINIC_OR_DEPARTMENT_OTHER): Payer: 59

## 2016-04-01 DIAGNOSIS — R1031 Right lower quadrant pain: Secondary | ICD-10-CM | POA: Diagnosis present

## 2016-04-01 DIAGNOSIS — F329 Major depressive disorder, single episode, unspecified: Secondary | ICD-10-CM | POA: Diagnosis not present

## 2016-04-01 DIAGNOSIS — N200 Calculus of kidney: Secondary | ICD-10-CM | POA: Insufficient documentation

## 2016-04-01 LAB — CBC WITH DIFFERENTIAL/PLATELET
BASOS ABS: 0 10*3/uL (ref 0.0–0.1)
BASOS PCT: 0 %
EOS ABS: 0.1 10*3/uL (ref 0.0–0.7)
EOS PCT: 1 %
HCT: 39.8 % (ref 36.0–46.0)
Hemoglobin: 13.4 g/dL (ref 12.0–15.0)
Lymphocytes Relative: 26 %
Lymphs Abs: 1.9 10*3/uL (ref 0.7–4.0)
MCH: 28.1 pg (ref 26.0–34.0)
MCHC: 33.7 g/dL (ref 30.0–36.0)
MCV: 83.4 fL (ref 78.0–100.0)
MONO ABS: 0.3 10*3/uL (ref 0.1–1.0)
Monocytes Relative: 4 %
Neutro Abs: 5 10*3/uL (ref 1.7–7.7)
Neutrophils Relative %: 69 %
PLATELETS: 255 10*3/uL (ref 150–400)
RBC: 4.77 MIL/uL (ref 3.87–5.11)
RDW: 13.5 % (ref 11.5–15.5)
WBC: 7.2 10*3/uL (ref 4.0–10.5)

## 2016-04-01 LAB — URINALYSIS, ROUTINE W REFLEX MICROSCOPIC
Bilirubin Urine: NEGATIVE
GLUCOSE, UA: NEGATIVE mg/dL
KETONES UR: NEGATIVE mg/dL
Leukocytes, UA: NEGATIVE
Nitrite: NEGATIVE
PH: 8 (ref 5.0–8.0)
PROTEIN: NEGATIVE mg/dL
Specific Gravity, Urine: 1.012 (ref 1.005–1.030)

## 2016-04-01 LAB — URINE MICROSCOPIC-ADD ON

## 2016-04-01 LAB — BASIC METABOLIC PANEL
ANION GAP: 10 (ref 5–15)
BUN: 10 mg/dL (ref 6–20)
CALCIUM: 9.6 mg/dL (ref 8.9–10.3)
CO2: 26 mmol/L (ref 22–32)
Chloride: 104 mmol/L (ref 101–111)
Creatinine, Ser: 0.74 mg/dL (ref 0.44–1.00)
Glucose, Bld: 115 mg/dL — ABNORMAL HIGH (ref 65–99)
Potassium: 3.9 mmol/L (ref 3.5–5.1)
Sodium: 140 mmol/L (ref 135–145)

## 2016-04-01 LAB — PREGNANCY, URINE: PREG TEST UR: NEGATIVE

## 2016-04-01 MED ORDER — KETOROLAC TROMETHAMINE 30 MG/ML IJ SOLN
30.0000 mg | Freq: Once | INTRAMUSCULAR | Status: AC
  Administered 2016-04-01: 30 mg via INTRAVENOUS
  Filled 2016-04-01: qty 1

## 2016-04-01 MED ORDER — METOCLOPRAMIDE HCL 5 MG/ML IJ SOLN
5.0000 mg | Freq: Once | INTRAMUSCULAR | Status: AC
  Administered 2016-04-01: 5 mg via INTRAVENOUS
  Filled 2016-04-01: qty 2

## 2016-04-01 MED ORDER — KETOROLAC TROMETHAMINE 10 MG PO TABS: 10.0000 mg | ORAL_TABLET | Freq: Four times a day (QID) | ORAL | Status: DC | PRN

## 2016-04-01 NOTE — Discharge Instructions (Signed)
Read the information below.   Your CT scan was positive for a right kidney stone. Your kidney function and white cell count are normal.  Use the prescribed medication as directed.  Please discuss all new medications with your pharmacist.   It is important that you remain hydrated. You are being prescribed pain medication. Take as needed for pain relief. It is important that you pump and dump your breastmilk while taking the medication and for an additional 24hrs following completion of medication. You can also take tylenol as well for pain control. It is important that you strain your urine.  You have been provided the contact information for the Urologist. Be sure to call and schedule an appointment for further evaluation.  You may return to the Emergency Department at any time for worsening condition or any new symptoms that concern you. Return to ED if you develop fever, pain that is not controlled at home, inability to keep fluids down, or inability to urinate.    Kidney Stones Kidney stones (urolithiasis) are deposits that form inside your kidneys. The intense pain is caused by the stone moving through the urinary tract. When the stone moves, the ureter goes into spasm around the stone. The stone is usually passed in the urine.  CAUSES   A disorder that makes certain neck glands produce too much parathyroid hormone (primary hyperparathyroidism).  A buildup of uric acid crystals, similar to gout in your joints.  Narrowing (stricture) of the ureter.  A kidney obstruction present at birth (congenital obstruction).  Previous surgery on the kidney or ureters.  Numerous kidney infections. SYMPTOMS   Feeling sick to your stomach (nauseous).  Throwing up (vomiting).  Blood in the urine (hematuria).  Pain that usually spreads (radiates) to the groin.  Frequency or urgency of urination. DIAGNOSIS   Taking a history and physical exam.  Blood or urine tests.  CT  scan.  Occasionally, an examination of the inside of the urinary bladder (cystoscopy) is performed. TREATMENT   Observation.  Increasing your fluid intake.  Extracorporeal shock wave lithotripsy--This is a noninvasive procedure that uses shock waves to break up kidney stones.  Surgery may be needed if you have severe pain or persistent obstruction. There are various surgical procedures. Most of the procedures are performed with the use of small instruments. Only small incisions are needed to accommodate these instruments, so recovery time is minimized. The size, location, and chemical composition are all important variables that will determine the proper choice of action for you. Talk to your health care provider to better understand your situation so that you will minimize the risk of injury to yourself and your kidney.  HOME CARE INSTRUCTIONS   Drink enough water and fluids to keep your urine clear or pale yellow. This will help you to pass the stone or stone fragments.  Strain all urine through the provided strainer. Keep all particulate matter and stones for your health care provider to see. The stone causing the pain may be as small as a grain of salt. It is very important to use the strainer each and every time you pass your urine. The collection of your stone will allow your health care provider to analyze it and verify that a stone has actually passed. The stone analysis will often identify what you can do to reduce the incidence of recurrences.  Only take over-the-counter or prescription medicines for pain, discomfort, or fever as directed by your health care provider.  Keep all follow-up visits  as told by your health care provider. This is important.  Get follow-up X-rays if required. The absence of pain does not always mean that the stone has passed. It may have only stopped moving. If the urine remains completely obstructed, it can cause loss of kidney function or even complete  destruction of the kidney. It is your responsibility to make sure X-rays and follow-ups are completed. Ultrasounds of the kidney can show blockages and the status of the kidney. Ultrasounds are not associated with any radiation and can be performed easily in a matter of minutes.  Make changes to your daily diet as told by your health care provider. You may be told to:  Limit the amount of salt that you eat.  Eat 5 or more servings of fruits and vegetables each day.  Limit the amount of meat, poultry, fish, and eggs that you eat.  Collect a 24-hour urine sample as told by your health care provider.You may need to collect another urine sample every 6-12 months. SEEK MEDICAL CARE IF:  You experience pain that is progressive and unresponsive to any pain medicine you have been prescribed. SEEK IMMEDIATE MEDICAL CARE IF:   Pain cannot be controlled with the prescribed medicine.  You have a fever or shaking chills.  The severity or intensity of pain increases over 18 hours and is not relieved by pain medicine.  You develop a new onset of abdominal pain.  You feel faint or pass out.  You are unable to urinate.   This information is not intended to replace advice given to you by your health care provider. Make sure you discuss any questions you have with your health care provider.   Document Released: 10/10/2005 Document Revised: 07/01/2015 Document Reviewed: 03/13/2013 Elsevier Interactive Patient Education [employer]

## 2016-04-01 NOTE — ED Provider Notes (Signed)
CSN: [id number]     Arrival date & time 04/01/16  0855 History   First MD Initiated Contact with Patient 04/01/16 [phone]     Chief Complaint  Patient presents with  . Abdominal Pain     (Consider location/radiation/quality/duration/timing/severity/associated sxs/prior Treatment) HPI Comments: [name] Guerra is a 35 y.o. Female [id number] with history of diverticulitis presents to ED with complaint of RLQ pain. Pain started at approximately 4am this morning. She describes the pain as intermittent, sharp, 7/10. Associated symptoms include chills, night sweats, nausea, and vomiting. She was seen at Urgent Care, who advised patient to go to ED. Patient denies urinary symptoms, vaginal pain, pelvic pain, vaginal bleeding, or vaginal discharge. No diarrhea. Patient errs on side of constipation. No chest pain or SOB. No dizziness or lightheadedness. She is sexually active with one female partner, no contraception used. Patient has a history of tubal ligation completed in march 2017. She has had 8 pregnancies resulting in 6 full term, vaginal deliveries. Patient denies any complications during pregnancy. She denies any further gynecologic conditions or procedures. Denies any abdominal conditions.   The history is provided by the patient.    Past Medical History  Diagnosis Date  . Dysrhythmia   . Gestational diabetes     diet controlled  . Diverticulitis   . Hx of Bell's palsy     as newborn  . SVD (spontaneous vaginal delivery) 05/24/2012  . Lymphadenitis     LEFT LOWER LEG  . Depression     POST PARTUM   Past Surgical History  Procedure Laterality Date  . Breast cyst excision    . Wisdom tooth extraction    . Transthoracic echocardiogram  02/09/2012    EF=55%, Patent foramen ovale is present, Doppler suggest left to right interatrial shunt  . Laparoscopic tubal ligation Bilateral 01/15/2016    Procedure: LAPAROSCOPIC TUBAL LIGATION;  Surgeon: Huel CoteKathy Richardson, MD;  Location: [facility];  Service:  Gynecology;  Laterality: Bilateral;   Family History  Problem Relation Age of Onset  . Anesthesia problems Neg Hx   . Diabetes Mother   . Heart disease Maternal Aunt   . Diabetes Maternal Aunt   . Diabetes Maternal Uncle   . Cancer Maternal Uncle     bone  . Diabetes Maternal Grandmother   . Hypertension Maternal Grandfather   . Heart disease Maternal Grandfather   . Diabetes Maternal Grandfather    Social History  Substance Use Topics  . Smoking status: Never Smoker   . Smokeless tobacco: Never Used  . Alcohol Use: No   OB History    Gravida Para Term Preterm AB TAB SAB Ectopic Multiple Living   8 6 6  2  2   0 5     Review of Systems  Constitutional: Positive for chills and diaphoresis.  Gastrointestinal: Positive for nausea, vomiting and abdominal pain.  All other systems reviewed and are negative.     Allergies  Review of patient's allergies indicates no known allergies.  Home Medications   Prior to Admission medications   Medication Sig Start Date End Date Taking? Authorizing Provider  HYDROcodone-acetaminophen (NORCO) 5-325 MG tablet Take 1 tablet by mouth every 6 (six) hours as needed for moderate pain. 01/15/16   Huel CoteKathy Richardson, MD  ketorolac (TORADOL) 10 MG tablet Take 1 tablet (10 mg total) by mouth every 6 (six) hours as needed for moderate pain or severe pain. 04/01/16   Lona KettleAshley Laurel Meyer, PA-C  SHAROBEL 0.35 MG tablet Take 1 tablet  by mouth daily. 12/06/15   Historical Provider, MD   BP 134/90 mmHg  Pulse 84  Temp(Src) 98.5 F (36.9 C) (Oral)  Resp 16  Ht 5\' 9"  (1.753 m)  Wt 104.327 kg  BMI 33.95 kg/m2  SpO2 99%  Breastfeeding? Yes Physical Exam  Constitutional: She appears well-developed and well-nourished. No distress.  HENT:  Head: Normocephalic and atraumatic.  Mouth/Throat: Oropharynx is clear and moist. No oropharyngeal exudate.  Eyes: Conjunctivae and EOM are normal. Pupils are equal, round, and reactive to light. Right eye exhibits no  discharge. Left eye exhibits no discharge. No scleral icterus.  Neck: Normal range of motion. Neck supple.  Cardiovascular: Normal rate, regular rhythm, normal heart sounds and intact distal pulses.   No murmur heard. Pulmonary/Chest: Effort normal and breath sounds normal. No respiratory distress.  Abdominal: Soft. Bowel sounds are normal. There is tenderness ( RLQ and suprapubic). There is no rebound, no guarding, no CVA tenderness and negative Murphy's sign.  Obese appearing abdomen.   Musculoskeletal: Normal range of motion.  Lymphadenopathy:    She has no cervical adenopathy.  Neurological: She is alert. Coordination normal.  Skin: Skin is warm and dry. She is not diaphoretic.  Psychiatric: She has a normal mood and affect. Her behavior is normal.    ED Course  Procedures (including critical care time) Labs Review Labs Reviewed  URINALYSIS, ROUTINE W REFLEX MICROSCOPIC (NOT AT [facility]) - Abnormal; Notable for the following:    APPearance CLOUDY (*)    Hgb urine dipstick LARGE (*)    All other components within normal limits  BASIC METABOLIC PANEL - Abnormal; Notable for the following:    Glucose, Bld 115 (*)    All other components within normal limits  URINE MICROSCOPIC-ADD ON - Abnormal; Notable for the following:    Squamous Epithelial / LPF 0-5 (*)    Bacteria, UA MANY (*)    All other components within normal limits  URINE CULTURE  PREGNANCY, URINE  CBC WITH DIFFERENTIAL/PLATELET    Imaging Review Ct Renal Stone Study  04/01/2016  CLINICAL DATA:  Right flank pain and hematuria since 4 o'clock this morning. EXAM: CT ABDOMEN AND PELVIS WITHOUT CONTRAST TECHNIQUE: Multidetector CT imaging of the abdomen and pelvis was performed following the standard protocol without IV contrast. COMPARISON:  02/17/2014 FINDINGS: Lower chest: The lung bases are clear of acute process. No pleural effusion or pulmonary lesions. The heart is normal in size. No pericardial effusion. The distal  esophagus and aorta are unremarkable. Hepatobiliary: No focal hepatic lesions or intrahepatic biliary dilatation. The gallbladder is normal. No common bile duct dilatation. Pancreas: No mass, inflammation or ductal dilatation. Spleen: Normal size.  No focal lesions. Adrenals/Urinary Tract: The adrenal glands are normal. Moderate right-sided hydroureteronephrosis down to an obstructing 4 mm calculus in the mid right ureter located at the L3-4 disc space level. No renal calculi. No left-sided ureteral calculi. No bladder calculi. No renal or bladder mass. Stomach/Bowel: The stomach, duodenum, small bowel and colon are grossly normal without oral contrast. No inflammatory changes, mass lesions or obstructive findings. The terminal ileum is normal. The appendix is normal. Vascular/Lymphatic: No mesenteric or retroperitoneal mass or adenopathy. The aorta is normal in caliber. No atherosclerotic calcifications. Reproductive: The uterus and ovaries are normal. Other: No pelvic mass or adenopathy. No free pelvic fluid collections. No inguinal mass or adenopathy. There is a small periumbilical abdominal wall hernia containing fat. Musculoskeletal: No significant bony findings. IMPRESSION: 1. 4 mm mid right ureteral calculus  causing moderate grade obstruction with hydroureteronephrosis. 2. No renal calculi. 3. No other acute abdominal/pelvic findings, mass lesions or lymphadenopathy. Electronically Signed   By: Rudie Meyer M.D.   On: 04/01/2016 11:33   I have personally reviewed and evaluated these images and lab results as part of my medical decision-making.   EKG Interpretation None      MDM   Final diagnoses:  Nephrolithiasis    Patient is aferbile and nontoxic appearing. Vital signs are stable. Physical exam remarkable for TTP of RLQ and suprapubic region without gaurding. Concern for nephrolithiasis. Additional considerations include appendicitis, UTI, and pyelonephritis. Check BMP, CBC, U/A, pregnancy  test.   Pregnancy test negative. BMP and CBC re-assuring. Urinalysis remarkable for hemoglobin. Some bacteria also noted; however, squamous epithelial cells present. Patient denies urinary symptoms, suspect contamination. Will send for culture. Given history, physical exam, and presence of blood in urine order CT renal study. Toradol and anti-emetic given.   Pain improved to 4/10. CT remarkable for 4mm right ureteral calculus, with moderate obstruction and hydroureteronephrosis. Kidney function stable. WBC normal. Doubt infected stone. Discussed results with patient. Encouraged PO fluids. Urine strainer provided. Rx toradol. Patient is breastfeeding advised to pump and dump while taking toradol and for 24hrs following cessation of medication per pharmacy instructions. Referral to urology for follow up and re-evaluation in next week. Return precautions discussed. Patient voiced understanding and is agreeable.      Lona Kettle, PA-C 04/01/16 2311  Jerelyn Scott, MD 04/08/16 [phone]

## 2016-04-01 NOTE — ED Notes (Signed)
Onset 4am with RLQ abd pain with nausea and vomiting.

## 2016-04-02 LAB — URINE CULTURE

## 2016-05-17 ENCOUNTER — Emergency Department (HOSPITAL_BASED_OUTPATIENT_CLINIC_OR_DEPARTMENT_OTHER)
Admission: EM | Admit: 2016-05-17 | Discharge: 2016-05-17 | Disposition: A | Discharge status: Home / Self Care | Payer: 59 | Attending: Emergency Medicine | Admitting: Emergency Medicine

## 2016-05-17 ENCOUNTER — Encounter (HOSPITAL_BASED_OUTPATIENT_CLINIC_OR_DEPARTMENT_OTHER): Payer: Self-pay | Admitting: Emergency Medicine

## 2016-05-17 ENCOUNTER — Emergency Department (HOSPITAL_BASED_OUTPATIENT_CLINIC_OR_DEPARTMENT_OTHER): Payer: 59

## 2016-05-17 DIAGNOSIS — F329 Major depressive disorder, single episode, unspecified: Secondary | ICD-10-CM | POA: Insufficient documentation

## 2016-05-17 DIAGNOSIS — R1031 Right lower quadrant pain: Secondary | ICD-10-CM | POA: Diagnosis present

## 2016-05-17 DIAGNOSIS — I10 Essential (primary) hypertension: Secondary | ICD-10-CM | POA: Insufficient documentation

## 2016-05-17 DIAGNOSIS — N200 Calculus of kidney: Secondary | ICD-10-CM

## 2016-05-17 HISTORY — DX: Calculus of kidney: N20.0

## 2016-05-17 LAB — BASIC METABOLIC PANEL
Anion gap: 7 (ref 5–15)
BUN: 10 mg/dL (ref 6–20)
CHLORIDE: 106 mmol/L (ref 101–111)
CO2: 25 mmol/L (ref 22–32)
CREATININE: 0.73 mg/dL (ref 0.44–1.00)
Calcium: 9.1 mg/dL (ref 8.9–10.3)
GFR calc Af Amer: >60 mL/min (ref >60)
GFR calc non Af Amer: >60 mL/min (ref >60)
GLUCOSE: 105 mg/dL — AB (ref 65–99)
POTASSIUM: 3.4 mmol/L — AB (ref 3.5–5.1)
Sodium: 138 mmol/L (ref 135–145)

## 2016-05-17 LAB — CBC WITH DIFFERENTIAL/PLATELET
Basophils Absolute: 0 10*3/uL (ref 0.0–0.1)
Basophils Relative: 0 %
EOS PCT: 2 %
Eosinophils Absolute: 0.2 10*3/uL (ref 0.0–0.7)
HCT: 36.6 % (ref 36.0–46.0)
HEMOGLOBIN: 12.2 g/dL (ref 12.0–15.0)
LYMPHS ABS: 2.6 10*3/uL (ref 0.7–4.0)
LYMPHS PCT: 40 %
MCH: 28.3 pg (ref 26.0–34.0)
MCHC: 33.3 g/dL (ref 30.0–36.0)
MCV: 84.9 fL (ref 78.0–100.0)
MONOS PCT: 9 %
Monocytes Absolute: 0.6 10*3/uL (ref 0.1–1.0)
NEUTROS PCT: 49 %
Neutro Abs: 3.1 10*3/uL (ref 1.7–7.7)
Platelets: 222 10*3/uL (ref 150–400)
RBC: 4.31 MIL/uL (ref 3.87–5.11)
RDW: 13.2 % (ref 11.5–15.5)
WBC: 6.4 10*3/uL (ref 4.0–10.5)

## 2016-05-17 LAB — URINALYSIS, ROUTINE W REFLEX MICROSCOPIC
GLUCOSE, UA: NEGATIVE mg/dL
KETONES UR: 15 mg/dL — AB
Nitrite: NEGATIVE
PROTEIN: 30 mg/dL — AB
Specific Gravity, Urine: 1.029 (ref 1.005–1.030)
pH: 6 (ref 5.0–8.0)

## 2016-05-17 LAB — URINE MICROSCOPIC-ADD ON

## 2016-05-17 LAB — PREGNANCY, URINE: PREG TEST UR: NEGATIVE

## 2016-05-17 MED ORDER — HYDROCODONE-ACETAMINOPHEN 5-325 MG PO TABS: 1.0000 | ORAL_TABLET | Freq: Four times a day (QID) | ORAL | 0 refills | Status: DC | PRN

## 2016-05-17 MED ORDER — KETOROLAC TROMETHAMINE 15 MG/ML IJ SOLN
15.0000 mg | Freq: Once | INTRAMUSCULAR | Status: AC
  Administered 2016-05-17: 15 mg via INTRAVENOUS
  Filled 2016-05-17: qty 1

## 2016-05-17 NOTE — ED Provider Notes (Addendum)
[facility]   CSN: [id number] Arrival date & time: 05/17/16  7829  First Provider Contact:  None       History   Chief Complaint Chief Complaint  Patient presents with  . Nephrolithiasis    HPI [name] Guerra is a 35 y.o. female.  Pt with a hx of kidney stone presenting today with right lower quadrant and suprapubic pain. Urinary frequency and mild dysuria. Patient had a kidney stone in mid June and states she is not sure that she ever cleared. She has seen the urologist multiple times and has had 1-2 weeks of being pain-free but over the last 2 weeks the pain has been persistent and has gotten significantly worse in the last 12 hours. She's been taking Tylenol and ibuprofen with only minimal improvement. She has nausea but no vomiting. She denies fever. She was told by her urologist if she was still having pain they would do a CT scan on Friday but given her worsening pain she arrived here today for further evaluation. She has not restarted menses since having her baby 6 months ago. She denies any vaginal discharge or bleeding at this time.   The history is provided by the patient.    Past Medical History:  Diagnosis Date  . Depression    POST PARTUM  . Diverticulitis   . Dysrhythmia   . Gestational diabetes    diet controlled  . Hx of Bell's palsy    as newborn  . Kidney stone   . Lymphadenitis    LEFT LOWER LEG  . SVD (spontaneous vaginal delivery) 05/24/2012    Patient Active Problem List   Diagnosis Date Noted  . Acute mastitis of right breast 10/10/2015  . Pregnancy induced hypertension 09/29/2015  . NSVD (normal spontaneous vaginal delivery) 09/29/2015  . HTN (hypertension) 05/01/2014  . Palpitations 05/01/2014  . Murmur 05/01/2014  . DOE (dyspnea on exertion) 05/01/2014  . Chest pain 05/01/2014    Past Surgical History:  Procedure Laterality Date  . BREAST CYST EXCISION    . LAPAROSCOPIC TUBAL LIGATION Bilateral 01/15/2016   Procedure: LAPAROSCOPIC TUBAL LIGATION;  Surgeon: Huel Cote, MD;  Location: [facility];  Service: Gynecology;  Laterality: Bilateral;  . TRANSTHORACIC ECHOCARDIOGRAM  02/09/2012   EF=55%, Patent foramen ovale is present, Doppler suggest left to right interatrial shunt  . WISDOM TOOTH EXTRACTION      OB History    Gravida Para Term Preterm AB Living   SAB TAB Ectopic Multiple Live Births   2     0         Home Medications    Prior to Admission medications   Medication Sig Start Date End Date Taking? Authorizing Provider  HYDROcodone-acetaminophen (NORCO) 5-325 MG tablet Take 1 tablet by mouth every 6 (six) hours as needed for moderate pain. 01/15/16   Huel Cote, MD  ketorolac (TORADOL) 10 MG tablet Take 1 tablet (10 mg total) by mouth every 6 (six) hours as needed for moderate pain or severe pain. 04/01/16   Lona Kettle, PA-C  SHAROBEL 0.35 MG tablet Take 1 tablet by mouth daily. 12/06/15   Historical Provider, MD    Family History Family History  Problem Relation Age of Onset  . Diabetes Mother   . Heart disease Maternal Aunt   . Diabetes Maternal Aunt   . Diabetes Maternal Uncle   . Cancer Maternal Uncle     bone  .  Diabetes Maternal Grandmother   . Hypertension Maternal Grandfather   . Heart disease Maternal Grandfather   . Diabetes Maternal Grandfather   . Anesthesia problems Neg Hx     Social History Social History  Substance Use Topics  . Smoking status: Never Smoker  . Smokeless tobacco: Never Used  . Alcohol use No     Allergies   Review of patient's allergies indicates no known allergies.   Review of Systems Review of Systems  All other systems reviewed and are negative.    Physical Exam Updated Vital Signs BP 136/90 (BP Location: Left Arm)   Pulse 103   Temp 98.1 F (36.7 C) (Oral)   Resp 18   Ht 5\' 9"  (1.753 m)   Wt 230 lb (104.3 kg)   SpO2 100%   BMI 33.97 kg/m   Physical Exam  Constitutional: She is  oriented to person, place, and time. She appears well-developed and well-nourished. No distress.  HENT:  Head: Normocephalic and atraumatic.  Eyes: EOM are normal. Pupils are equal, round, and reactive to light.  Cardiovascular: Normal rate, regular rhythm, normal heart sounds and intact distal pulses.  Exam reveals no friction rub.   No murmur heard. Pulmonary/Chest: Effort normal and breath sounds normal. She has no wheezes. She has no rales.  Abdominal: Soft. Bowel sounds are normal. She exhibits no distension. There is tenderness in the right lower quadrant and suprapubic area. There is CVA tenderness. There is no rebound and no guarding.  Musculoskeletal: Normal range of motion. She exhibits no tenderness.  No edema  Neurological: She is alert and oriented to person, place, and time. No cranial nerve deficit.  Skin: Skin is warm and dry. No rash noted.  Psychiatric: She has a normal mood and affect. Her behavior is normal.  Nursing note and vitals reviewed.    ED Treatments / Results  Labs (all labs ordered are listed, but only abnormal results are displayed) Labs Reviewed  URINALYSIS, ROUTINE W REFLEX MICROSCOPIC (NOT AT [facility]) - Abnormal; Notable for the following:       Result Value   Color, Urine AMBER (*)    APPearance CLOUDY (*)    Hgb urine dipstick LARGE (*)    Bilirubin Urine SMALL (*)    Ketones, ur 15 (*)    Protein, ur 30 (*)    Leukocytes, UA TRACE (*)    All other components within normal limits  URINE MICROSCOPIC-ADD ON - Abnormal; Notable for the following:    Squamous Epithelial / LPF 0-5 (*)    Bacteria, UA RARE (*)    All other components within normal limits  BASIC METABOLIC PANEL - Abnormal; Notable for the following:    Potassium 3.4 (*)    Glucose, Bld 105 (*)    All other components within normal limits  PREGNANCY, URINE  CBC WITH DIFFERENTIAL/PLATELET    EKG  EKG Interpretation None       Radiology Ct Renal Stone Study  Result Date:  05/17/2016 CLINICAL DATA:  Right flank pain. Diagnosed with obstructing kidney stones 04/01/2016. Pain has recurred in the right lower quadrant. EXAM: CT ABDOMEN AND PELVIS WITHOUT CONTRAST TECHNIQUE: Multidetector CT imaging of the abdomen and pelvis was performed following the standard protocol without IV contrast. COMPARISON:  CT abdomen and pelvis 04/01/2016. FINDINGS: Lower chest: The lung bases are clear without focal nodule, mass, or airspace disease. Hepatobiliary: No focal pedicle lesions are present. The margins are smooth. The common bile duct and gallbladder are within  normal limits. Pancreas: The pancreatic mass or cyst is present. There is no duct dilation. Spleen: Normal size and density.  No focal lesions are present. Adrenals/Urinary Tract: The adrenal glands are normal bilaterally. The kidneys are unremarkable. No stone or mass lesion is present. There is no hydronephrosis. Previously noted obstructing stone in the right ureter is no longer present about location. Of note, I am there is a 6 mm stone within the urinary bladder which may have passed recently. This stone is near the right UVJ but without ureteral obstruction it is likely to have passed into the bladder. The bladder is mostly decompressed. The left kidney in ureter are unremarkable. Stomach/Bowel: The stomach and duodenum are within normal limits. The small bowel is unremarkable. An umbilical hernia contains fat. There is no associated bowel obstruction. The appendix is visualized and is normal. The ascending and transverse colon are within normal limits. The descending and rectosigmoid colon are within normal limits as well. Vascular/Lymphatic: No significant vascular lesions are present. There is no significant adenopathy. Reproductive: Within normal limits for age. Other: No significant free fluid is present. Musculoskeletal: Sclerotic changes are noted at the SI joints bilaterally, suggesting sacroiliitis. There is no fusion.  Vertebral body heights alignment are maintained. No other focal lytic or blastic lesions are present. IMPRESSION: 1. 6 mm stone appears to be within the urinary bladder, representing a passed stone. 2. Previously seen right ureteral calculus is no longer present. This may be the same stone. 3. Previous hydroureteronephrosis has resolved. 4. Sclerotic changes of in the iliac bones bilaterally suggesting some degree of sacroiliitis, slightly worse on the right. Electronically Signed   By: Marin Roberts M.D.   On: 05/17/2016 07:55   Procedures Procedures (including critical care time)  Medications Ordered in ED Medications  ketorolac (TORADOL) 15 MG/ML injection 15 mg (not administered)     Initial Impression / Assessment and Plan / ED Course  I have reviewed the triage vital signs and the nursing notes.  Pertinent labs & imaging results that were available during my care of the patient were reviewed by me and considered in my medical decision making (see chart for details).  Clinical Course   Pt with symptoms consistent with kidney stone which has been ongoing for the last 2 weeks.  Denies infectious sx, or GI symptoms.  Low concern for diverticulitis and no risk factors or history suggestive of AAA.  No hx suggestive of GU source (discharge) and otherwise pt is healthy.  Will treat pain and ensure no infection with UA, CBC, BMP and will get stone study to further eval. 8:18 AM Labs wnl.  CT shows a recently passed 89mm stone in the bladder which is definitely the source of pt's pain.  Pt had no signs of infection.  She will be d/ced home with pain control and will pump and dump breast milk for the next few days.  Final Clinical Impressions(s) / ED Diagnoses   Final diagnoses:  Right kidney stone    New Prescriptions New Prescriptions   HYDROCODONE-ACETAMINOPHEN (NORCO/VICODIN) 5-325 MG TABLET    Take 1-2 tablets by mouth every 6 (six) hours as needed.     Gwyneth Sprout,  MD 05/17/16 2951    Gwyneth Sprout, MD 05/17/16 [phone]

## 2016-05-17 NOTE — ED Triage Notes (Signed)
Pt c/o right groin and suprapubic pain with nausea. Pt was dx with a kidney stone in June and has pain intermittently since that time. Pt has followed up with urology, stone has not showed up on any xrays but has continued to have blood in urine.

## 2016-05-17 NOTE — ED Notes (Signed)
Patient given prescription for Norco. Verbalizes understanding of use. Ambulatory to check-out with NAD noted.

## 2017-01-04 ENCOUNTER — Emergency Department (HOSPITAL_BASED_OUTPATIENT_CLINIC_OR_DEPARTMENT_OTHER): Payer: 59

## 2017-01-04 ENCOUNTER — Emergency Department (HOSPITAL_BASED_OUTPATIENT_CLINIC_OR_DEPARTMENT_OTHER)
Admission: EM | Admit: 2017-01-04 | Discharge: 2017-01-04 | Disposition: A | Discharge status: Home / Self Care | Payer: 59 | Attending: Emergency Medicine | Admitting: Emergency Medicine

## 2017-01-04 ENCOUNTER — Encounter (HOSPITAL_BASED_OUTPATIENT_CLINIC_OR_DEPARTMENT_OTHER): Payer: Self-pay

## 2017-01-04 DIAGNOSIS — R110 Nausea: Secondary | ICD-10-CM | POA: Insufficient documentation

## 2017-01-04 DIAGNOSIS — I10 Essential (primary) hypertension: Secondary | ICD-10-CM | POA: Diagnosis not present

## 2017-01-04 DIAGNOSIS — Z79899 Other long term (current) drug therapy: Secondary | ICD-10-CM | POA: Diagnosis not present

## 2017-01-04 DIAGNOSIS — M549 Dorsalgia, unspecified: Secondary | ICD-10-CM | POA: Insufficient documentation

## 2017-01-04 DIAGNOSIS — R509 Fever, unspecified: Secondary | ICD-10-CM | POA: Insufficient documentation

## 2017-01-04 DIAGNOSIS — R109 Unspecified abdominal pain: Secondary | ICD-10-CM | POA: Insufficient documentation

## 2017-01-04 DIAGNOSIS — N939 Abnormal uterine and vaginal bleeding, unspecified: Secondary | ICD-10-CM | POA: Insufficient documentation

## 2017-01-04 LAB — URINALYSIS, ROUTINE W REFLEX MICROSCOPIC
Bilirubin Urine: NEGATIVE
GLUCOSE, UA: NEGATIVE mg/dL
HGB URINE DIPSTICK: NEGATIVE
Ketones, ur: NEGATIVE mg/dL
Leukocytes, UA: NEGATIVE
Nitrite: NEGATIVE
Protein, ur: NEGATIVE mg/dL
SPECIFIC GRAVITY, URINE: 1.025 (ref 1.005–1.030)
pH: 5.5 (ref 5.0–8.0)

## 2017-01-04 LAB — PREGNANCY, URINE: PREG TEST UR: NEGATIVE

## 2017-01-04 MED ORDER — IBUPROFEN 600 MG PO TABS
600.0000 mg | ORAL_TABLET | Freq: Three times a day (TID) | ORAL | 0 refills | Status: DC | PRN
Start: 2017-01-04 — End: 2018-09-04

## 2017-01-04 MED ORDER — ONDANSETRON 8 MG PO TBDP
8.0000 mg | ORAL_TABLET | Freq: Once | ORAL | Status: AC
  Administered 2017-01-04: 8 mg via ORAL
  Filled 2017-01-04: qty 1

## 2017-01-04 MED ORDER — IBUPROFEN 400 MG PO TABS
600.0000 mg | ORAL_TABLET | Freq: Once | ORAL | Status: AC
  Administered 2017-01-04: 10:00:00 600 mg via ORAL
  Filled 2017-01-04: qty 1

## 2017-01-04 MED ORDER — ONDANSETRON 4 MG PO TBDP: 4.0000 mg | ORAL_TABLET | Freq: Three times a day (TID) | ORAL | 0 refills | Status: DC | PRN

## 2017-01-04 NOTE — ED Provider Notes (Signed)
[facility] Provider [facility]   CSN: [id number] Arrival date & time: 01/04/17  0902     History   Chief Complaint Chief Complaint  Patient presents with  . Flank Pain    HPI [name] Guerra is a 36 y.o. female.  HPI  36 year old female presents with abdominal pain and back pain for 3 or 4 days. Has had nausea without vomiting. Had a temperature 100 yesterday but no fevers otherwise. No dysuria or hematuria although she's had blood when wiping. She states she has mild vaginal bleeding but not enough to use a pad or tampon. She had her menstrual cycle earlier this month to this is atypical. She tested herself her pregnancy and was negative. Feels similar but not as severe as when she had a kidney stone requiring a stent. Feels worse on the left. Tried excedrin without much relief.  Past Medical History:  Diagnosis Date  . Depression    POST PARTUM  . Diverticulitis   . Dysrhythmia   . Gestational diabetes    diet controlled  . Hx of Bell's palsy    as newborn  . Kidney stone   . Lymphadenitis    LEFT LOWER LEG  . SVD (spontaneous vaginal delivery) 05/24/2012    Patient Active Problem List   Diagnosis Date Noted  . Acute mastitis of right breast 10/10/2015  . Pregnancy induced hypertension 09/29/2015  . NSVD (normal spontaneous vaginal delivery) 09/29/2015  . HTN (hypertension) 05/01/2014  . Palpitations 05/01/2014  . Murmur 05/01/2014  . DOE (dyspnea on exertion) 05/01/2014  . Chest pain 05/01/2014    Past Surgical History:  Procedure Laterality Date  . BREAST CYST EXCISION    . LAPAROSCOPIC TUBAL LIGATION Bilateral 01/15/2016   Procedure: LAPAROSCOPIC TUBAL LIGATION;  Surgeon: Huel Cote, MD;  Location: [facility];  Service: Gynecology;  Laterality: Bilateral;  . TRANSTHORACIC ECHOCARDIOGRAM  02/09/2012   EF=55%, Patent foramen ovale is present, Doppler suggest left to right interatrial shunt  . WISDOM TOOTH EXTRACTION      OB History    Gravida  Para Term Preterm AB Living   8 6 6   2 5    SAB TAB Ectopic Multiple Live Births   2     0 5       Home Medications    Prior to Admission medications   Medication Sig Start Date End Date Taking? Authorizing Provider  HYDROcodone-acetaminophen (NORCO) 5-325 MG tablet Take 1 tablet by mouth every 6 (six) hours as needed for moderate pain. 01/15/16   Huel Cote, MD  HYDROcodone-acetaminophen (NORCO/VICODIN) 5-325 MG tablet Take 1-2 tablets by mouth every 6 (six) hours as needed. 05/17/16   Gwyneth Sprout, MD  ibuprofen (ADVIL,MOTRIN) 600 MG tablet Take 1 tablet (600 mg total) by mouth every 8 (eight) hours as needed for moderate pain. 01/04/17   Pricilla Loveless, MD  ketorolac (TORADOL) 10 MG tablet Take 1 tablet (10 mg total) by mouth every 6 (six) hours as needed for moderate pain or severe pain. 04/01/16   Lona Kettle, PA-C  ondansetron (ZOFRAN ODT) 4 MG disintegrating tablet Take 1 tablet (4 mg total) by mouth every 8 (eight) hours as needed for nausea or vomiting. 01/04/17   Pricilla Loveless, MD  SHAROBEL 0.35 MG tablet Take 1 tablet by mouth daily. 12/06/15   Historical Provider, MD    Family History Family History  Problem Relation Age of Onset  . Diabetes Mother   . Heart disease Maternal Aunt   .  Diabetes Maternal Aunt   . Diabetes Maternal Uncle   . Cancer Maternal Uncle     bone  . Diabetes Maternal Grandmother   . Hypertension Maternal Grandfather   . Heart disease Maternal Grandfather   . Diabetes Maternal Grandfather   . Anesthesia problems Neg Hx     Social History Social History  Substance Use Topics  . Smoking status: Never Smoker  . Smokeless tobacco: Never Used  . Alcohol use No     Allergies   Patient has no known allergies.   Review of Systems Review of Systems  Constitutional: Positive for fever (100).  Gastrointestinal: Positive for abdominal pain and nausea. Negative for vomiting.  Genitourinary: Positive for vaginal bleeding. Negative  for dysuria, frequency, hematuria and vaginal discharge.  Musculoskeletal: Positive for back pain.  All other systems reviewed and are negative.    Physical Exam Updated Vital Signs BP 130/84 (BP Location: Right Arm)   Pulse 84   Temp 97.3 F (36.3 C) (Oral)   Resp 18   Ht 5\' 9"  (1.753 m)   Wt 247 lb 6.4 oz (112.2 kg)   LMP 12/22/2016   SpO2 100%   BMI 36.53 kg/m   Physical Exam  Constitutional: She is oriented to person, place, and time. She appears well-developed and well-nourished. No distress.  HENT:  Head: Normocephalic and atraumatic.  Right Ear: External ear normal.  Left Ear: External ear normal.  Nose: Nose normal.  Eyes: Right eye exhibits no discharge. Left eye exhibits no discharge.  Cardiovascular: Normal rate, regular rhythm and normal heart sounds.   Pulmonary/Chest: Effort normal and breath sounds normal.  Abdominal: Soft. There is tenderness. There is no CVA tenderness.    No anterior abdominal or pelvic tenderness  Neurological: She is alert and oriented to person, place, and time.  Skin: Skin is warm and dry. She is not diaphoretic.  Nursing note and vitals reviewed.    ED Treatments / Results  Labs (all labs ordered are listed, but only abnormal results are displayed) Labs Reviewed  URINALYSIS, ROUTINE W REFLEX MICROSCOPIC  PREGNANCY, URINE    EKG  EKG Interpretation None       Radiology Dg Abdomen 1 View  Result Date: 01/04/2017 CLINICAL DATA:  Three day history of abdominal pain. EXAM: ABDOMEN - 1 VIEW COMPARISON:  None. FINDINGS: The bowel gas pattern is normal. No radio-opaque calculi or other significant radiographic abnormality are seen. IMPRESSION: Negative. Electronically Signed   By: Kennith Center M.D.   On: 01/04/2017 11:28   US Renal  Result Date: 01/04/2017 CLINICAL DATA:  Left sided abdominal and flank pain for 4 days, history kidney stones EXAM: RENAL / URINARY TRACT ULTRASOUND COMPLETE COMPARISON:  CT abdomen and pelvis  of 05/17/2016 FINDINGS: Right Kidney: Length: 11.7 cm. No hydronephrosis is seen. Renal calculi are not detected, the largest of 6 mm in diameter. Left Kidney: Length: 11.8 cm. No hydronephrosis is seen. The all small renal calculi, the largest of 6 mm in diameter. Bladder: The urinary bladder is not well distended but a right ureteral jet is visualized. The left ureteral jet is not seen. IMPRESSION: 1. Nonobstructing bilateral renal calculi.  No hydronephrosis. 2. The right ureteral jet is visualized. No left ureteral jet is seen. Electronically Signed   By: Dwyane Dee M.D.   On: 01/04/2017 11:52    Procedures Procedures (including critical care time)  Medications Ordered in ED Medications  ibuprofen (ADVIL,MOTRIN) tablet 600 mg (600 mg Oral Given 01/04/17 0958)  ondansetron (ZOFRAN-ODT) disintegrating tablet 8 mg (8 mg Oral Given 01/04/17 [id number])     Initial Impression / Assessment and Plan / ED Course  I have reviewed the triage vital signs and the nursing notes.  Pertinent labs & imaging results that were available during my care of the patient were reviewed by me and considered in my medical decision making (see chart for details).  Clinical Course as of Jan 04 1246  Wed Jan 04, 2017  [id number] Urinalysis and pregnancy tests are negative. The only focal area of tenderness is left flank. There is no lower abdominal tenderness. No CVA tenderness. She overall appears well. Somewhat atypical for a renal stone but given her history we'll get a KUB and ultrasound. No lower abdominal tenderness to suggest pelvic etiology. She currently declines pelvic exam.  [SG]    Clinical Course User Index [SG] Pricilla LovelessScott Dewanda Fennema, MD    I suspicion for an acute intra-abdominal emergency is low. KUB and ultrasound are unremarkable including no hydronephrosis on the left. She has renal calculi but no obvious ureteral calculus. No hematuria or UTI on urine. She is not pregnant. The pain is reproducible in her left side  only. No abdominal tenderness. Patient defers pelvic exam. Although I think at this time a GU pathology is less likely. She already is going to follow-up with her urologist, I think suspicion of a large or infected stone is low as she does not have intractable pain, vomiting, fevers, or UTI. I think that a CT scan would not make much difference and disposition or plan. She will still be treated with NSAIDs and anti-emetics. Follow-up with PCP. I discussed this with shared decision making through patient who agrees but also I discussed strict return precautions.  Final Clinical Impressions(s) / ED Diagnoses   Final diagnoses:  Left flank pain    New Prescriptions New Prescriptions   IBUPROFEN (ADVIL,MOTRIN) 600 MG TABLET    Take 1 tablet (600 mg total) by mouth every 8 (eight) hours as needed for moderate pain.   ONDANSETRON (ZOFRAN ODT) 4 MG DISINTEGRATING TABLET    Take 1 tablet (4 mg total) by mouth every 8 (eight) hours as needed for nausea or vomiting.     Pricilla LovelessScott Denarius Sesler, MD 01/04/17 1249

## 2017-01-04 NOTE — ED Notes (Signed)
Pt ambulated to US

## 2017-01-04 NOTE — ED Triage Notes (Signed)
Left sided flank pain since Sunday. Some nausea. Denies urinary symptoms. Reports hx of same.

## 2018-01-26 ENCOUNTER — Other Ambulatory Visit: Payer: Self-pay

## 2018-01-26 ENCOUNTER — Encounter (HOSPITAL_BASED_OUTPATIENT_CLINIC_OR_DEPARTMENT_OTHER): Payer: Self-pay | Admitting: *Deleted

## 2018-01-26 ENCOUNTER — Emergency Department (HOSPITAL_BASED_OUTPATIENT_CLINIC_OR_DEPARTMENT_OTHER)
Admission: EM | Admit: 2018-01-26 | Discharge: 2018-01-26 | Disposition: A | Discharge status: Home / Self Care | Payer: 59 | Attending: Emergency Medicine | Admitting: Emergency Medicine

## 2018-01-26 DIAGNOSIS — Y9389 Activity, other specified: Secondary | ICD-10-CM | POA: Diagnosis not present

## 2018-01-26 DIAGNOSIS — Y999 Unspecified external cause status: Secondary | ICD-10-CM | POA: Insufficient documentation

## 2018-01-26 DIAGNOSIS — M7918 Myalgia, other site: Secondary | ICD-10-CM | POA: Diagnosis present

## 2018-01-26 DIAGNOSIS — I10 Essential (primary) hypertension: Secondary | ICD-10-CM | POA: Insufficient documentation

## 2018-01-26 DIAGNOSIS — Y92410 Unspecified street and highway as the place of occurrence of the external cause: Secondary | ICD-10-CM | POA: Insufficient documentation

## 2018-01-26 DIAGNOSIS — M79601 Pain in right arm: Secondary | ICD-10-CM | POA: Diagnosis not present

## 2018-01-26 DIAGNOSIS — Z79899 Other long term (current) drug therapy: Secondary | ICD-10-CM | POA: Diagnosis not present

## 2018-01-26 MED ORDER — NAPROXEN 500 MG PO TABS: 500.0000 mg | ORAL_TABLET | Freq: Two times a day (BID) | ORAL | 0 refills | Status: DC
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
## 2018-01-28 ENCOUNTER — Encounter (HOSPITAL_BASED_OUTPATIENT_CLINIC_OR_DEPARTMENT_OTHER): Payer: Self-pay | Admitting: Emergency Medicine

## 2018-01-28 ENCOUNTER — Emergency Department (HOSPITAL_BASED_OUTPATIENT_CLINIC_OR_DEPARTMENT_OTHER)
Admission: EM | Admit: 2018-01-28 | Discharge: 2018-01-28 | Disposition: A | Discharge status: Home / Self Care | Payer: 59 | Attending: Emergency Medicine | Admitting: Emergency Medicine

## 2018-01-28 ENCOUNTER — Other Ambulatory Visit: Payer: Self-pay

## 2018-01-28 DIAGNOSIS — Y9389 Activity, other specified: Secondary | ICD-10-CM | POA: Insufficient documentation

## 2018-01-28 DIAGNOSIS — M545 Low back pain: Secondary | ICD-10-CM

## 2018-01-28 DIAGNOSIS — Y998 Other external cause status: Secondary | ICD-10-CM | POA: Insufficient documentation

## 2018-01-28 DIAGNOSIS — S3992XA Unspecified injury of lower back, initial encounter: Secondary | ICD-10-CM | POA: Diagnosis present

## 2018-01-28 DIAGNOSIS — Y92410 Unspecified street and highway as the place of occurrence of the external cause: Secondary | ICD-10-CM | POA: Insufficient documentation

## 2018-01-28 DIAGNOSIS — S39012A Strain of muscle, fascia and tendon of lower back, initial encounter: Secondary | ICD-10-CM | POA: Diagnosis not present

## 2018-01-28 DIAGNOSIS — I10 Essential (primary) hypertension: Secondary | ICD-10-CM | POA: Diagnosis not present

## 2018-01-28 MED ORDER — METHOCARBAMOL 500 MG PO TABS: 500.0000 mg | ORAL_TABLET | Freq: Two times a day (BID) | ORAL | 0 refills | Status: DC
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
## 2018-03-07 ENCOUNTER — Emergency Department (HOSPITAL_BASED_OUTPATIENT_CLINIC_OR_DEPARTMENT_OTHER)
Admission: EM | Admit: 2018-03-07 | Discharge: 2018-03-07 | Disposition: A | Discharge status: Home / Self Care | Payer: 59 | Attending: Emergency Medicine | Admitting: Emergency Medicine

## 2018-03-07 ENCOUNTER — Encounter (HOSPITAL_BASED_OUTPATIENT_CLINIC_OR_DEPARTMENT_OTHER): Payer: Self-pay | Admitting: Emergency Medicine

## 2018-03-07 ENCOUNTER — Other Ambulatory Visit: Payer: Self-pay

## 2018-03-07 ENCOUNTER — Emergency Department (HOSPITAL_BASED_OUTPATIENT_CLINIC_OR_DEPARTMENT_OTHER): Payer: 59

## 2018-03-07 DIAGNOSIS — M79662 Pain in left lower leg: Secondary | ICD-10-CM

## 2018-03-07 DIAGNOSIS — Z79899 Other long term (current) drug therapy: Secondary | ICD-10-CM | POA: Insufficient documentation

## 2018-03-07 DIAGNOSIS — I890 Lymphedema, not elsewhere classified: Secondary | ICD-10-CM | POA: Insufficient documentation

## 2018-03-07 DIAGNOSIS — I10 Essential (primary) hypertension: Secondary | ICD-10-CM | POA: Diagnosis not present

## 2018-03-07 DIAGNOSIS — M7989 Other specified soft tissue disorders: Secondary | ICD-10-CM

## 2018-03-07 LAB — BASIC METABOLIC PANEL
Anion gap: 6 (ref 5–15)
BUN: 11 mg/dL (ref 6–20)
CALCIUM: 8.5 mg/dL — AB (ref 8.9–10.3)
CHLORIDE: 109 mmol/L (ref 101–111)
CO2: 23 mmol/L (ref 22–32)
CREATININE: 0.68 mg/dL (ref 0.44–1.00)
GFR calc non Af Amer: >60 mL/min (ref >60)
Glucose, Bld: 95 mg/dL (ref 65–99)
Potassium: 4 mmol/L (ref 3.5–5.1)
SODIUM: 138 mmol/L (ref 135–145)

## 2018-03-07 LAB — CBC
HCT: 35.5 % — ABNORMAL LOW (ref 36.0–46.0)
Hemoglobin: 12 g/dL (ref 12.0–15.0)
MCH: 28 pg (ref 26.0–34.0)
MCHC: 33.8 g/dL (ref 30.0–36.0)
MCV: 82.8 fL (ref 78.0–100.0)
PLATELETS: 230 10*3/uL (ref 150–400)
RBC: 4.29 MIL/uL (ref 3.87–5.11)
RDW: 14.6 % (ref 11.5–15.5)
WBC: 5.1 10*3/uL (ref 4.0–10.5)

## 2018-03-07 MED ORDER — IBUPROFEN 400 MG PO TABS
600.0000 mg | ORAL_TABLET | Freq: Once | ORAL | Status: AC
  Administered 2018-03-07: 600 mg via ORAL
  Filled 2018-03-07: qty 1

## 2018-03-07 NOTE — ED Notes (Signed)
ED Provider at bedside. 

## 2018-03-07 NOTE — Discharge Instructions (Addendum)
You may choose ibuprofen and/or Tylenol as needed for pain, the most helpful thing for your pain will be elevating your leg as much as possible, you can also wear compression stockings.  Your ultrasound today showed no evidence of DVT, and your lab work overall looks good.  Please follow-up in the next 2 to 3 days with your primary care doctor for recheck.  Return to the emergency department for worsening swelling, redness, or warmth of the leg, or any chest pain or shortness of breath or other concerning symptoms.

## 2018-03-07 NOTE — ED Notes (Signed)
Patient transported to Ultrasound 

## 2018-03-07 NOTE — ED Triage Notes (Signed)
Swelling, pain and tenderness to left leg x3 days.  Pt sts she has lymphedema in left leg anyway, but it has never hurt before.  Sts it feels "Like I've got the flu in my leg."
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
## 2018-09-04 ENCOUNTER — Encounter (HOSPITAL_COMMUNITY): Payer: Self-pay

## 2018-09-04 ENCOUNTER — Inpatient Hospital Stay (HOSPITAL_COMMUNITY)
Admission: AD | Admit: 2018-09-04 | Discharge: 2018-09-04 | Disposition: A | Discharge status: Home / Self Care | Payer: 59 | Source: Ambulatory Visit | Attending: Obstetrics and Gynecology | Admitting: Obstetrics and Gynecology

## 2018-09-04 DIAGNOSIS — R109 Unspecified abdominal pain: Secondary | ICD-10-CM

## 2018-09-04 DIAGNOSIS — N939 Abnormal uterine and vaginal bleeding, unspecified: Secondary | ICD-10-CM

## 2018-09-04 DIAGNOSIS — Z7982 Long term (current) use of aspirin: Secondary | ICD-10-CM | POA: Insufficient documentation

## 2018-09-04 LAB — URINALYSIS, ROUTINE W REFLEX MICROSCOPIC
BILIRUBIN URINE: NEGATIVE
GLUCOSE, UA: NEGATIVE mg/dL
HGB URINE DIPSTICK: NEGATIVE
Ketones, ur: NEGATIVE mg/dL
Leukocytes, UA: NEGATIVE
Nitrite: NEGATIVE
Protein, ur: NEGATIVE mg/dL
SPECIFIC GRAVITY, URINE: 1.017 (ref 1.005–1.030)
pH: 6 (ref 5.0–8.0)

## 2018-09-04 LAB — POCT PREGNANCY, URINE: PREG TEST UR: NEGATIVE

## 2018-09-04 LAB — CBC
HEMATOCRIT: 36.3 % (ref 36.0–46.0)
HEMOGLOBIN: 11.7 g/dL — AB (ref 12.0–15.0)
MCH: 27.3 pg (ref 26.0–34.0)
MCHC: 32.2 g/dL (ref 30.0–36.0)
MCV: 84.6 fL (ref 80.0–100.0)
Platelets: 214 10*3/uL (ref 150–400)
RBC: 4.29 MIL/uL (ref 3.87–5.11)
RDW: 14.3 % (ref 11.5–15.5)
WBC: 6.6 10*3/uL (ref 4.0–10.5)
nRBC: 0 % (ref 0.0–0.2)

## 2018-09-04 MED ORDER — KETOROLAC TROMETHAMINE 60 MG/2ML IM SOLN
60.0000 mg | Freq: Once | INTRAMUSCULAR | Status: AC
  Administered 2018-09-04: 60 mg via INTRAMUSCULAR
  Filled 2018-09-04: qty 2

## 2018-09-04 NOTE — MAU Note (Signed)
Pt complains of bleeding between periods and heavier periods since February. States she had a lighter than usual period on 10/30. States this past Saturday she and her husband had intercourse and she had "a lot of blood" and has continued to have some bleeding since. Pt reports pelvic pain and lower back pain. States she called to make an appointment with Dr. Senaida Ores yesterday and has not had a call back

## 2018-09-04 NOTE — Discharge Instructions (Signed)
Abnormal Uterine Bleeding Abnormal uterine bleeding can affect women at various stages in life, including teenagers, women in their reproductive years, pregnant women, and women who have reached menopause. Several kinds of uterine bleeding are considered abnormal, including:  Bleeding or spotting between periods.  Bleeding after sexual intercourse.  Bleeding that is heavier or more than normal.  Periods that last longer than usual.  Bleeding after menopause. Many cases of abnormal uterine bleeding are minor and simple to treat, while others are more serious. Any type of abnormal bleeding should be evaluated by your health care provider. Treatment will depend on the cause of the bleeding. Follow these instructions at home: Monitor your condition for any changes. The following actions may help to alleviate any discomfort you are experiencing:  Avoid the use of tampons and douches as directed by your health care provider.  Change your pads frequently. You should get regular pelvic exams and Pap tests. Keep all follow-up appointments for diagnostic tests as directed by your health care provider. Contact a health care provider if:  Your bleeding lasts more than 1 week.  You feel dizzy at times. Get help right away if:  You pass out.  You are changing pads every 15 to 30 minutes.  You have abdominal pain.  You have a fever.  You become sweaty or weak.  You are passing large blood clots from the vagina.  You start to feel nauseous and vomit. This information is not intended to replace advice given to you by your health care provider. Make sure you discuss any questions you have with your health care provider. Document Released: 10/10/2005 Document Revised: 03/23/2016 Document Reviewed: 05/09/2013 Elsevier Interactive Patient Education  2017 Elsevier Inc.  

## 2018-09-04 NOTE — MAU Provider Note (Signed)
Chief Complaint: Abdominal Pain and Vaginal Bleeding   First Provider Initiated Contact with Patient 09/04/18 0130     SUBJECTIVE HPI: [name] Guerra is a 37 y.o. non pregnant female who presents to Maternity Admissions reporting menstrual issues & abdominal pain. Normally sees Dr. Senaida Ores at [facility] ob[facility]. Last saw her a year ago. Had normal pap smear last year. Was offered surgery at that time for tx of menorrhagia.  Pt called office yesterday for appointment d/t current symptoms but hasn't heard back yet.  Prior to this year, had monthly cycles that were heavy & lasted a week. Since February has had intermenstrual bleeding in addition to her heavy cycles. LMP 08/22/18. Started having bleeding again over the weekend after intercourse. Today feels lower abdominal cramping and low back pain. Had been taking Excedrin without relief but hasn't taken anything for pain since Sunday.   Location: lower abdomen & low back  Quality: cramping Severity: 9/10 on pain scale Duration: 3 days Timing: intermittent Modifying factors: nothing makes better or worse. Hasn't taken anything for pain since Sunday Associated signs and symptoms: vaginal bleeding  Past Medical History:  Diagnosis Date  . Depression    POST PARTUM  . Diverticulitis   . Dysrhythmia   . Gestational diabetes    diet controlled  . Hx of Bell's palsy    as newborn  . Kidney stone   . Lymphadenitis    LEFT LOWER LEG  . SVD (spontaneous vaginal delivery) 05/24/2012   OB History  Gravida Para Term Preterm AB Living  8 6 6   2 6   SAB TAB Ectopic Multiple Live Births  2     0 6    # Outcome Date GA Lbr Len/2nd Weight Sex Delivery Anes PTL Lv  8 Term 09/29/15 [redacted]w[redacted]d 04:28 / 00:03 3565 g F Vag-Spont EPI  LIV  7 Term 05/22/12 [redacted]w[redacted]d 07:41 / 00:08 4167 g F Vag-Spont EPI  LIV  6 SAB 2012 [redacted]w[redacted]d         5 Term 2010 [redacted]w[redacted]d 04:00 3685 g F Vag-Spont None  LIV  4 Term 2006 [redacted]w[redacted]d 06:00 3714 g F Vag-Spont None  LIV  3 SAB 2005 [redacted]w[redacted]d          2 Term 2004 [redacted]w[redacted]d 09:00 3345 g F Vag-Spont None  LIV  1 Term 2001 [redacted]w[redacted]d 36:00 3799 g F Vag-Spont None  LIV   Past Surgical History:  Procedure Laterality Date  . BREAST CYST EXCISION    . LAPAROSCOPIC TUBAL LIGATION Bilateral 01/15/2016   Procedure: LAPAROSCOPIC TUBAL LIGATION;  Surgeon: Huel Cote, MD;  Location: [facility];  Service: Gynecology;  Laterality: Bilateral;  . TRANSTHORACIC ECHOCARDIOGRAM  02/09/2012   EF=55%, Patent foramen ovale is present, Doppler suggest left to right interatrial shunt  . WISDOM TOOTH EXTRACTION     Social History   Socioeconomic History  . Marital status: Married    Spouse name: Not on file  . Number of children: Not on file  . Years of education: Not on file  . Highest education level: Not on file  Occupational History  . Not on file  Social Needs  . Financial resource strain: Not on file  . Food insecurity:    Worry: Not on file    Inability: Not on file  . Transportation needs:    Medical: Not on file    Non-medical: Not on file  Tobacco Use  . Smoking status: Never Smoker  . Smokeless tobacco: Never Used  Substance and  Sexual Activity  . Alcohol use: No  . Drug use: No  . Sexual activity: Yes    Birth control/protection: Surgical  Lifestyle  . Physical activity:    Days per week: Not on file    Minutes per session: Not on file  . Stress: Not on file  Relationships  . Social connections:    Talks on phone: Not on file    Gets together: Not on file    Attends religious service: Not on file    Active member of club or organization: Not on file    Attends meetings of clubs or organizations: Not on file    Relationship status: Not on file  . Intimate partner violence:    Fear of current or ex partner: Not on file    Emotionally abused: Not on file    Physically abused: Not on file    Forced sexual activity: Not on file  Other Topics Concern  . Not on file  Social History Narrative  . Not on file   Family History   Problem Relation Age of Onset  . Diabetes Mother   . Heart disease Maternal Aunt   . Diabetes Maternal Aunt   . Diabetes Maternal Uncle   . Cancer Maternal Uncle        bone  . Diabetes Maternal Grandmother   . Hypertension Maternal Grandfather   . Heart disease Maternal Grandfather   . Diabetes Maternal Grandfather   . Anesthesia problems Neg Hx    No current facility-administered medications on file prior to encounter.    Current Outpatient Medications on File Prior to Encounter  Medication Sig Dispense Refill  . aspirin-acetaminophen-caffeine (EXCEDRIN MIGRAINE) 250-250-65 MG tablet Take by mouth every 6 (six) hours as needed for headache.    Marland Kitchen SHAROBEL 0.35 MG tablet Take 1 tablet by mouth daily.  1   No Known Allergies  I have reviewed patient's Past Medical Hx, Surgical Hx, Family Hx, Social Hx, medications and allergies.   Review of Systems  Constitutional: Negative.   Gastrointestinal: Positive for abdominal pain.  Genitourinary: Positive for menstrual problem and vaginal bleeding.  Musculoskeletal: Positive for back pain.    OBJECTIVE Patient Vitals for the past 24 hrs:  BP Temp Temp src Pulse Resp SpO2 Height Weight  09/04/18 0213 (!) 142/91 - - 87 18 - - -  09/04/18 0102 (!) 159/98 98.1 F (36.7 C) Oral 98 20 100 % 5\' 9"  (1.753 m) 112.9 kg   Constitutional: Well-developed, well-nourished female in no acute distress.  Cardiovascular: normal rate & rhythm, no murmur Respiratory: normal rate and effort. Lung sounds clear throughout GI: Abd soft, non-tender, Pos BS x 4. No guarding or rebound tenderness MS: Extremities nontender, no edema, normal ROM Neurologic: Alert and oriented x 4.  GU:     SPECULUM EXAM: NEFG, cervix pink & smooth. Minimal amount of dark red/brown mucoid blood.   BIMANUAL: No CMT. uterus normal size, no adnexal tenderness or masses.    LAB RESULTS Results for orders placed or performed during the hospital encounter of 09/04/18 (from the  past 24 hour(s))  Urinalysis, Routine w reflex microscopic     Status: None   Collection Time: 09/04/18  1:21 AM  Result Value Ref Range   Color, Urine YELLOW YELLOW   APPearance CLEAR CLEAR   Specific Gravity, Urine 1.017 1.005 - 1.030   pH 6.0 5.0 - 8.0   Glucose, UA NEGATIVE NEGATIVE mg/dL   Hgb urine dipstick NEGATIVE NEGATIVE  Bilirubin Urine NEGATIVE NEGATIVE   Ketones, ur NEGATIVE NEGATIVE mg/dL   Protein, ur NEGATIVE NEGATIVE mg/dL   Nitrite NEGATIVE NEGATIVE   Leukocytes, UA NEGATIVE NEGATIVE  Pregnancy, urine POC     Status: None   Collection Time: 09/04/18  1:23 AM  Result Value Ref Range   Preg Test, Ur NEGATIVE NEGATIVE  CBC     Status: Abnormal   Collection Time: 09/04/18  1:51 AM  Result Value Ref Range   WBC 6.6 4.0 - 10.5 K/uL   RBC 4.29 3.87 - 5.11 MIL/uL   Hemoglobin 11.7 (L) 12.0 - 15.0 g/dL   HCT 16.1 09.6 - 04.5 %   MCV 84.6 80.0 - 100.0 fL   MCH 27.3 26.0 - 34.0 pg   MCHC 32.2 30.0 - 36.0 g/dL   RDW 40.9 81.1 - 91.4 %   Platelets 214 150 - 400 K/uL   nRBC 0.0 0.0 - 0.2 %    IMAGING No results found.  MAU COURSE Orders Placed This Encounter  Procedures  . Urinalysis, Routine w reflex microscopic  . CBC  . Pregnancy, urine POC  . Discharge patient   Meds ordered this encounter  Medications  . ketorolac (TORADOL) injection 60 mg    MDM UPT negative Toradol 60 mg IM -- pt reports improvement in pain Minimal bleeding on exam. Uterus normal size on bimanual.  CBC -- hemoglobin 11.7  Pt stable for discharge. Encouraged to f/u with Dr. Senaida Ores for further evaluation and management.   ASSESSMENT 1. Abnormal uterine bleeding (AUB)     PLAN Discharge home in stable condition.  Follow-up Information    [facility] Follow up.   Contact information: [address]  [address] [place] [address] [phone]          Allergies as of 09/04/2018   No Known Allergies     Medication List    STOP taking these  medications   HYDROcodone-acetaminophen 5-325 MG tablet Commonly known as:  NORCO/VICODIN   ibuprofen 600 MG tablet Commonly known as:  ADVIL,MOTRIN   ketorolac 10 MG tablet Commonly known as:  TORADOL   methocarbamol 500 MG tablet Commonly known as:  ROBAXIN   naproxen 500 MG tablet Commonly known as:  NAPROSYN   ondansetron 4 MG disintegrating tablet Commonly known as:  ZOFRAN-ODT     TAKE these medications   aspirin-acetaminophen-caffeine 250-250-65 MG tablet Commonly known as:  EXCEDRIN MIGRAINE Take by mouth every 6 (six) hours as needed for headache.   SHAROBEL 0.35 MG tablet Generic drug:  norethindrone Take 1 tablet by mouth daily.        Judeth Horn, NP 09/04/2018  2:29 AM

## 2019-03-28 ENCOUNTER — Other Ambulatory Visit: Payer: Self-pay | Admitting: Gynecology

## 2019-03-28 DIAGNOSIS — N644 Mastodynia: Secondary | ICD-10-CM

## 2019-04-10 ENCOUNTER — Ambulatory Visit
Admission: RE | Admit: 2019-04-10 | Discharge: 2019-04-10 | Disposition: A | Discharge status: Home / Self Care | Payer: 59 | Source: Ambulatory Visit | Attending: Gynecology | Admitting: Gynecology

## 2019-04-10 ENCOUNTER — Other Ambulatory Visit: Payer: Self-pay

## 2019-04-10 ENCOUNTER — Ambulatory Visit: Payer: 59

## 2019-04-10 DIAGNOSIS — N644 Mastodynia: Secondary | ICD-10-CM

## 2019-09-04 ENCOUNTER — Other Ambulatory Visit: Payer: Self-pay

## 2019-09-04 ENCOUNTER — Encounter: Payer: Self-pay | Admitting: Internal Medicine

## 2019-09-04 ENCOUNTER — Ambulatory Visit: Payer: 59 | Admitting: Internal Medicine

## 2019-09-04 VITALS — BP 144/91 | HR 97 | Ht 69.0 in | Wt 238.2 lb

## 2019-09-04 DIAGNOSIS — I493 Ventricular premature depolarization: Secondary | ICD-10-CM

## 2019-09-04 DIAGNOSIS — R0683 Snoring: Secondary | ICD-10-CM

## 2019-09-04 DIAGNOSIS — E669 Obesity, unspecified: Secondary | ICD-10-CM | POA: Diagnosis not present

## 2019-09-04 DIAGNOSIS — I10 Essential (primary) hypertension: Secondary | ICD-10-CM | POA: Diagnosis not present

## 2019-09-04 MED ORDER — NEBIVOLOL HCL 5 MG PO TABS: 5.0000 mg | ORAL_TABLET | Freq: Every day | ORAL | 3 refills | Status: DC

## 2019-09-04 MED ORDER — OLMESARTAN MEDOXOMIL-HCTZ 40-25 MG PO TABS: 1.0000 | ORAL_TABLET | Freq: Every day | ORAL | 3 refills | Status: DC

## 2019-09-04 NOTE — Patient Instructions (Signed)
Medication Instructions:  Dr Debara Pickett has recommended making the following medication changes: 1. START Bystolic 5 mg - take 1 tablet by mouth daily  *If you need a refill on your cardiac medications before your next appointment, please call your pharmacy*  Testing/Procedures: Your physician has recommended that you have a sleep study. This test records several body functions during sleep, including: brain activity, eye movement, oxygen and carbon dioxide blood levels, heart rate and rhythm, breathing rate and rhythm, the flow of air through your mouth and nose, snoring, body muscle movements, and chest and belly movement.  You will be contacted to schedule this appointment.  Follow-Up: At [facility], you and your health needs are our priority.  As part of our continuing mission to provide you with exceptional heart care, we have created designated Provider Care Teams.  These Care Teams include your primary Cardiologist (physician) and Advanced Practice Providers (APPs -  Physician Assistants and Nurse Practitioners) who all work together to provide you with the care you need, when you need it.  Your next appointment:   2 months  The format for your next appointment:   In Person  Provider:   You may see No primary care provider on file. or one of the following Advanced Practice Providers on your designated Care Team:    Almyra Deforest, PA-C  Fabian Sharp, PA-C or   Roby Lofts, [place]

## 2019-09-04 NOTE — Progress Notes (Signed)
OFFICE NOTE  Chief Complaint:  Fatigue, hypertension, palpitations  Primary Care Physician: Raenette RoverStein, William, PA  HPI:  [name] Guerra is a pleasant 38 year old female who was previously seen by Dr. Herbie BaltimoreHarding in our office in 2013. She was referred during her 31st week of pregnancy, which incidentally was her fifth pregnancy, for evaluation of palpitations. She probably had an echocardiogram in the past that showed mild concentric LVH and a small PFO with a small left to right shunt. The right atrium was apparently normal in size. She underwent monitoring which demonstrated periods of sinus tachycardia but no arrhythmias or any significant extrasystoles. No additional workup was recommended at that time. She now presents for evaluation of palpitations. Her primary care physician apparently told her that she had an extra beat on the right side of her heart, that her heart was racing and that she may need surgery. She reports fatigue, nausea, weakness, dyspnea on exertion, chest pain, lightheadedness, dizziness, leg swelling, and palpitations.  Most of these symptoms it started over the past several months. 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  With regards to chest pain she describes it as sharp and very short in duration. She is more bothered by shortness of breath and ongoing fatigue. She reports fairly good sleep at night and her husband reports that she does snore occasionally but generally is noted to sleep well without significant apneic events.  Recently she was started on medication for hypertension which included minoxidil. She was subsequently seen in the emergency room and not felt to have a cardiac chest pain cause. Hydrochlorothiazide was added and it seems to have only worsened her symptoms.  [name] returns today for followup. She reports her symptoms have improved. Her echo was  essentially unremarkable which showed a hyperdynamic EF likely taper her murmur. She underwent an exercise treadmill stress test which was negative for ischemia. There were some PACs noted in recovery. Since changing her medications her blood pressures been well controlled and she's had no adverse symptoms. I think she's currently on good medications.  09/04/2019  [name] seen today as a new patient.  I last saw her in 2015 and she is considered a new patient.  Previously we had seen her for palpitations and chest pain.  She underwent exercise stress testing which was negative for ischemia.  She was noted to have some PVCs and we discussed therapy with a beta-blocker but she was not started on at that time.  Since then she has had some progressive hypertension.  Recently she is required several adjustments in her medications but currently is on olmesartan/HCTZ 40/25 mg daily.  Blood pressure today was 144/91 and generally runs around the 140s to 150s.  She is also noted some continued palpitations.  Additionally, she reports fatigue during the day.  She does not get restful night sleep.  She says this is partly in due to her number of kids that she has to take care of but she is also noted to have a history of snoring and has moderate obesity.  She did fill out a sleepiness score scale today which was 11, suggestive of possible sleep apnea.  PMHx:  Past Medical History:  Diagnosis Date  . Depression    POST PARTUM  . Diverticulitis   . Dysrhythmia   . Gestational diabetes    diet controlled  .  Hx of Bell's palsy    as newborn  . Kidney stone   . Lymphadenitis    LEFT LOWER LEG  . SVD (spontaneous vaginal delivery) 05/24/2012    Past Surgical History:  Procedure Laterality Date  . BREAST CYST EXCISION    . LAPAROSCOPIC TUBAL LIGATION Bilateral 01/15/2016   Procedure: LAPAROSCOPIC TUBAL LIGATION;  Surgeon: Paula Compton, MD;  Location: [facility];  Service: Gynecology;  Laterality: Bilateral;   . TRANSTHORACIC ECHOCARDIOGRAM  02/09/2012   EF=55%, Patent foramen ovale is present, Doppler suggest left to right interatrial shunt  . WISDOM TOOTH EXTRACTION      FAMHx:  Family History  Problem Relation Age of Onset  . Diabetes Mother   . Heart disease Maternal Aunt   . Diabetes Maternal Aunt   . Diabetes Maternal Uncle   . Cancer Maternal Uncle        bone  . Diabetes Maternal Grandmother   . Hypertension Maternal Grandfather   . Heart disease Maternal Grandfather   . Diabetes Maternal Grandfather   . Anesthesia problems Neg Hx     SOCHx:   reports that she has never smoked. She has never used smokeless tobacco. She reports that she does not drink alcohol or use drugs.  ALLERGIES:  Allergies  Allergen Reactions  . Iron Other (See Comments)    Other reaction(s): CONSTIPATION Other reaction(s): CONSTIPATION   . Lisinopril Cough    ROS: A comprehensive review of systems was negative.  HOME MEDS: Current Outpatient Medications  Medication Sig Dispense Refill  . aspirin-acetaminophen-caffeine (EXCEDRIN MIGRAINE) 250-250-65 MG tablet Take by mouth every 6 (six) hours as needed for headache.    . olmesartan-hydrochlorothiazide (BENICAR HCT) 40-25 MG tablet Take 1 tablet by mouth daily.    Marland Kitchen SHAROBEL 0.35 MG tablet Take 1 tablet by mouth daily.  1   No current facility-administered medications for this visit.     LABS/IMAGING: No results found for this or any previous visit (from the past 48 hour(s)). No results found.  VITALS: BP (!) 144/91   Pulse 97   Ht 5\' 9"  (1.753 m)   Wt 238 lb 3.2 oz (108 kg)   SpO2 99%   BMI 35.18 kg/m   EXAM: General appearance: alert, no distress and moderately obese Neck: no carotid bruit, no JVD and thyroid not enlarged, symmetric, no tenderness/mass/nodules Lungs: clear to auscultation bilaterally Heart: regular rate and rhythm Abdomen: soft, non-tender; bowel sounds normal; no masses,  no organomegaly Extremities:  extremities normal, atraumatic, no cyanosis or edema Pulses: 2+ and symmetric Skin: Skin color, texture, turgor normal. No rashes or lesions Neurologic: Grossly normal Psych: Pleasant  EKG: Sinus rhythm at 97-personally reviewed  ASSESSMENT: 1. Hypertension 2. PVC's 3. Moderate obesity 4. Snorting with elevated EPWSS of 11  PLAN: 1.   Mrs. Laurance Flatten is still having some PVCs and has uncontrolled hypertension.  This is despite being on combination medication for blood pressure.  I think she might benefit from addition of a beta-blocker.  Her heart rate tends to run elevated anyhow.  To minimize side effects since she has fatigue, I would recommend we try Bystolic 5 mg daily.  This could be increased as tolerated.  Additionally, I think she should have a sleep study.  Her sleepiness score scale is over 10, there is moderate obesity, witnessed snoring and daytime fatigue.  This could be contributing to uncontrolled hypertension and her palpitations.  She also has moderate caffeine use and I encouraged her to decrease that  as much as possible.  We will plan follow-up with her in about a month.  Chrystie Nose, MD, [facility], FACP  [place]  [facility]  Medical Director of the [facility]  [facility]iovascular [facility] Reduction Clinic Diplomate of the American Board of Clinical Lipidology Attending Cardiologist  Direct Dial: [phone]  Fax: [phone]  Website:  [email or web address] 09/04/2019, 2:57 PM

## 2019-09-05 ENCOUNTER — Telehealth: Payer: Self-pay | Admitting: *Deleted

## 2019-09-05 NOTE — Telephone Encounter (Signed)
Patient notified of HST appointment. 

## 2019-09-27 ENCOUNTER — Other Ambulatory Visit: Payer: Self-pay

## 2019-09-27 ENCOUNTER — Emergency Department (HOSPITAL_BASED_OUTPATIENT_CLINIC_OR_DEPARTMENT_OTHER): Payer: 59

## 2019-09-27 ENCOUNTER — Emergency Department (HOSPITAL_BASED_OUTPATIENT_CLINIC_OR_DEPARTMENT_OTHER)
Admission: EM | Admit: 2019-09-27 | Discharge: 2019-09-27 | Disposition: A | Discharge status: Home / Self Care | Payer: 59 | Attending: Emergency Medicine | Admitting: Emergency Medicine

## 2019-09-27 ENCOUNTER — Encounter (HOSPITAL_BASED_OUTPATIENT_CLINIC_OR_DEPARTMENT_OTHER): Payer: Self-pay | Admitting: *Deleted

## 2019-09-27 DIAGNOSIS — R1032 Left lower quadrant pain: Secondary | ICD-10-CM | POA: Insufficient documentation

## 2019-09-27 DIAGNOSIS — Z5321 Procedure and treatment not carried out due to patient leaving prior to being seen by health care provider: Secondary | ICD-10-CM | POA: Insufficient documentation

## 2019-09-27 LAB — PREGNANCY, URINE: Preg Test, Ur: NEGATIVE

## 2019-09-27 NOTE — ED Triage Notes (Signed)
Pt c/o left flank pain x 2 days , hx stones

## 2019-11-01 ENCOUNTER — Encounter (HOSPITAL_BASED_OUTPATIENT_CLINIC_OR_DEPARTMENT_OTHER): Payer: 59 | Admitting: Cardiovascular Disease

## 2019-11-06 ENCOUNTER — Ambulatory Visit: Payer: 59 | Admitting: Internal Medicine

## 2019-11-08 ENCOUNTER — Telehealth: Payer: Self-pay | Admitting: Internal Medicine

## 2019-11-08 NOTE — Telephone Encounter (Signed)
Called pt and let her know about samples ready for her at the front. Will send to nurse for prior auth to be filled out for bystolic medication.

## 2019-11-08 NOTE — Telephone Encounter (Signed)
Bystolic or Benicar HCT? New insurance?  Last prescribed Nov/2020.   Talked to [facility] (prefer pharmacy). Medication not covered is Bystolic - NEED PRIOR AUTHORIZATION.   Please submit Prior authorization to insurance. Please provide samples and co-pay card if needed?

## 2019-11-08 NOTE — Telephone Encounter (Signed)
New message:     Patient calling concerning a refill on Bystolic 40-25 mg. Patient states that her insurance will not cover this medication. Patient would like to know if there is something else that she can get. Please call patient back.

## 2019-11-11 ENCOUNTER — Encounter: Payer: Self-pay | Admitting: Internal Medicine

## 2019-11-11 NOTE — Telephone Encounter (Signed)
PA for bystolic submitted ([employer]) - [id number]

## 2019-11-11 NOTE — Telephone Encounter (Signed)
Pt c/o medication issue:  1. Name of Medication:   nebivolol (BYSTOLIC) 5 MG tablet   2. How are you currently taking this medication (dosage and times per day)? 1 tablet by mouth daily   3. Are you having a reaction (difficulty breathing--STAT)? No   4. What is your medication issue? Patient is calling back stating she got a message today 11/11/19 to come pick up her Bystolic medication. She states she called the pharmacy to see if there had been a price change since calling our office on 11/08/19 and it was still going to be $207.99. Patient states she still has samples but was just  wanting to know if Dr. Rennis Golden had decided what he wanted to do in regards to it.

## 2019-11-11 NOTE — Telephone Encounter (Signed)
ID [id number] BIN [id number] PCN [id number] [employer]

## 2019-11-11 NOTE — Telephone Encounter (Signed)
Error

## 2019-11-12 ENCOUNTER — Other Ambulatory Visit: Payer: Self-pay

## 2019-11-12 MED ORDER — NEBIVOLOL HCL 5 MG PO TABS: 5.0000 mg | ORAL_TABLET | Freq: Every day | ORAL | 3 refills | Status: DC

## 2019-11-12 NOTE — Telephone Encounter (Signed)
PA for Bystolic denied per [email or web address]  Why was my request denied?  This request was denied because you did not meet the following clinical requirements: The requested medication and/or diagnosis are not a covered benefit and excluded from coverage in accordance with the terms and conditions of your plan benefit. Therefore, the request has been administratively denied. The requested medication and/or diagnosis are not a covered benefit and are excluded from coverage in accordance with the terms and conditions of your plan benefit. Therefore, this request has been administratively denied. The reason(s) [employer] did not approve this medication can be found above. This denial is based on our Bystolic drug coverage policy, in addition to any supplementary information you or your prescriber may have submitted. Our utilization review process included a clinical peer in the same or similar specialty that made the adverse determination.   To file an appeal, please send any written comments, documents or other relevant documentation with your appeal to the address listed below:  [employer] [address] [place], [place] [address] Phone: Please call the toll-free member number listed on your health plan ID card. Fax: [phone] Expedited / Urgent Fax: [phone]

## 2019-11-20 NOTE — Telephone Encounter (Signed)
Since they won't cover Bystolic - switch to coreg 3.125 mg BID.  Dr. Rexene Edison

## 2019-11-20 NOTE — Telephone Encounter (Signed)
Patient called back to see if our office had any more information about getting the reduced price for her medication. She has not heard anything herself yet

## 2019-11-20 NOTE — Telephone Encounter (Signed)
Any advice on a med change? Appeal?

## 2019-11-21 NOTE — Telephone Encounter (Signed)
LMTCB - see MD note below

## 2019-11-22 MED ORDER — CARVEDILOL 3.125 MG PO TABS: 3.1250 mg | ORAL_TABLET | Freq: Two times a day (BID) | ORAL | 3 refills | Status: DC

## 2019-11-22 NOTE — Telephone Encounter (Signed)
Patient aware of MD recommendation for med change and agrees w/plan Rx(s) sent to pharmacy electronically.

## 2020-12-25 ENCOUNTER — Emergency Department (HOSPITAL_BASED_OUTPATIENT_CLINIC_OR_DEPARTMENT_OTHER): Payer: 59

## 2020-12-25 ENCOUNTER — Encounter (HOSPITAL_BASED_OUTPATIENT_CLINIC_OR_DEPARTMENT_OTHER): Payer: Self-pay | Admitting: Emergency Medicine

## 2020-12-25 ENCOUNTER — Other Ambulatory Visit (HOSPITAL_COMMUNITY): Payer: Self-pay | Admitting: Emergency Medicine

## 2020-12-25 ENCOUNTER — Other Ambulatory Visit: Payer: Self-pay

## 2020-12-25 ENCOUNTER — Emergency Department (HOSPITAL_BASED_OUTPATIENT_CLINIC_OR_DEPARTMENT_OTHER)
Admission: EM | Admit: 2020-12-25 | Discharge: 2020-12-25 | Disposition: A | Discharge status: Home / Self Care | Payer: 59 | Attending: Emergency Medicine | Admitting: Emergency Medicine

## 2020-12-25 DIAGNOSIS — Z7982 Long term (current) use of aspirin: Secondary | ICD-10-CM | POA: Insufficient documentation

## 2020-12-25 DIAGNOSIS — R109 Unspecified abdominal pain: Secondary | ICD-10-CM | POA: Diagnosis present

## 2020-12-25 DIAGNOSIS — I10 Essential (primary) hypertension: Secondary | ICD-10-CM | POA: Insufficient documentation

## 2020-12-25 DIAGNOSIS — Z79899 Other long term (current) drug therapy: Secondary | ICD-10-CM | POA: Diagnosis not present

## 2020-12-25 DIAGNOSIS — N83201 Unspecified ovarian cyst, right side: Secondary | ICD-10-CM | POA: Insufficient documentation

## 2020-12-25 DIAGNOSIS — R1031 Right lower quadrant pain: Secondary | ICD-10-CM

## 2020-12-25 LAB — CBC WITH DIFFERENTIAL/PLATELET
Abs Immature Granulocytes: 0.01 10*3/uL (ref 0.00–0.07)
Basophils Absolute: 0 10*3/uL (ref 0.0–0.1)
Basophils Relative: 1 %
Eosinophils Absolute: 0.2 10*3/uL (ref 0.0–0.5)
Eosinophils Relative: 3 %
HCT: 37.5 % (ref 36.0–46.0)
Hemoglobin: 12.4 g/dL (ref 12.0–15.0)
Immature Granulocytes: 0 %
Lymphocytes Relative: 43 %
Lymphs Abs: 2.2 10*3/uL (ref 0.7–4.0)
MCH: 28.6 pg (ref 26.0–34.0)
MCHC: 33.1 g/dL (ref 30.0–36.0)
MCV: 86.4 fL (ref 80.0–100.0)
Monocytes Absolute: 0.4 10*3/uL (ref 0.1–1.0)
Monocytes Relative: 8 %
Neutro Abs: 2.3 10*3/uL (ref 1.7–7.7)
Neutrophils Relative %: 45 %
Platelets: 252 10*3/uL (ref 150–400)
RBC: 4.34 MIL/uL (ref 3.87–5.11)
RDW: 14.1 % (ref 11.5–15.5)
WBC: 5.2 10*3/uL (ref 4.0–10.5)
nRBC: 0 % (ref 0.0–0.2)

## 2020-12-25 LAB — URINALYSIS, ROUTINE W REFLEX MICROSCOPIC
Bilirubin Urine: NEGATIVE
Glucose, UA: NEGATIVE mg/dL
Hgb urine dipstick: NEGATIVE
Ketones, ur: NEGATIVE mg/dL
Leukocytes,Ua: NEGATIVE
Nitrite: NEGATIVE
Protein, ur: NEGATIVE mg/dL
Specific Gravity, Urine: 1.02 (ref 1.005–1.030)
pH: 6 (ref 5.0–8.0)

## 2020-12-25 LAB — BASIC METABOLIC PANEL
Anion gap: 8 (ref 5–15)
BUN: 9 mg/dL (ref 6–20)
CO2: 26 mmol/L (ref 22–32)
Calcium: 8.9 mg/dL (ref 8.9–10.3)
Chloride: 103 mmol/L (ref 98–111)
Creatinine, Ser: 0.68 mg/dL (ref 0.44–1.00)
GFR, Estimated: >60 mL/min (ref >60)
Glucose, Bld: 100 mg/dL — ABNORMAL HIGH (ref 70–99)
Potassium: 3.9 mmol/L (ref 3.5–5.1)
Sodium: 137 mmol/L (ref 135–145)

## 2020-12-25 LAB — WET PREP, GENITAL
Clue Cells Wet Prep HPF POC: NONE SEEN
Sperm: NONE SEEN
Trich, Wet Prep: NONE SEEN
Yeast Wet Prep HPF POC: NONE SEEN

## 2020-12-25 LAB — PREGNANCY, URINE: Preg Test, Ur: NEGATIVE

## 2020-12-25 MED ORDER — ACETAMINOPHEN 500 MG PO TABS
1000.0000 mg | ORAL_TABLET | Freq: Once | ORAL | Status: AC
  Administered 2020-12-25: 1000 mg via ORAL
  Filled 2020-12-25: qty 2

## 2020-12-25 MED ORDER — KETOROLAC TROMETHAMINE 15 MG/ML IJ SOLN
15.0000 mg | Freq: Once | INTRAMUSCULAR | Status: AC
  Administered 2020-12-25: 15 mg via INTRAVENOUS
  Filled 2020-12-25: qty 1

## 2020-12-25 MED ORDER — HYDROCODONE-ACETAMINOPHEN 5-325 MG PO TABS: 1.0000 | ORAL_TABLET | Freq: Four times a day (QID) | ORAL | 0 refills | Status: DC | PRN

## 2020-12-25 MED ORDER — ACETAMINOPHEN 500 MG PO TABS: 1000.0000 mg | ORAL_TABLET | Freq: Once | ORAL | Status: DC

## 2020-12-25 MED FILL — HYDROCODON-APAP 5-325: 5-325 | 3 days supply | Qty: 10 | Fill #0

## 2020-12-25 NOTE — ED Notes (Signed)
CT awaiting results of Upreg prior to imaging per [facility] protocol

## 2020-12-25 NOTE — ED Provider Notes (Signed)
[facility] EMERGENCY DEPARTMENT Provider Note   CSN: [id number] Arrival date & time: 12/25/20  0941     History Chief Complaint  Patient presents with  . Flank Pain    [name] Guerra is a 40 y.o. female.  The history is provided by the patient.  Flank Pain This is a recurrent problem. Episode onset: 3 days ago. The problem occurs daily. The problem has been gradually worsening. Associated symptoms comments: Right sided flank pain radiating around to the right groin.  Nausea without vomiting.  No fever, dysuria, frequency or urgency.  No fever or diarrhea.. Nothing aggravates the symptoms. Nothing relieves the symptoms. She has tried acetaminophen for the symptoms. The treatment provided no relief.       Past Medical History:  Diagnosis Date  . Depression    POST PARTUM  . Diverticulitis   . Dysrhythmia   . Gestational diabetes    diet controlled  . Hx of Bell's palsy    as newborn  . Kidney stone   . Lymphadenitis    LEFT LOWER LEG  . SVD (spontaneous vaginal delivery) 05/24/2012    Patient Active Problem List   Diagnosis Date Noted  . Acute mastitis of right breast 10/10/2015  . Pregnancy induced hypertension 09/29/2015  . NSVD (normal spontaneous vaginal delivery) 09/29/2015  . HTN (hypertension) 05/01/2014  . Palpitations 05/01/2014  . Murmur 05/01/2014  . DOE (dyspnea on exertion) 05/01/2014  . Chest pain 05/01/2014    Past Surgical History:  Procedure Laterality Date  . BREAST CYST EXCISION    . LAPAROSCOPIC TUBAL LIGATION Bilateral 01/15/2016   Procedure: LAPAROSCOPIC TUBAL LIGATION;  Surgeon: Huel Cote, MD;  Location: [facility];  Service: Gynecology;  Laterality: Bilateral;  . TRANSTHORACIC ECHOCARDIOGRAM  02/09/2012   EF=55%, Patent foramen ovale is present, Doppler suggest left to right interatrial shunt  . WISDOM TOOTH EXTRACTION       OB History    Gravida  8   Para  6   Term  6   Preterm      AB  2   Living  6      SAB  2   IAB      Ectopic      Multiple  0   Live Births  6           Family History  Problem Relation Age of Onset  . Diabetes Mother   . Heart disease Maternal Aunt   . Diabetes Maternal Aunt   . Diabetes Maternal Uncle   . Cancer Maternal Uncle        bone  . Diabetes Maternal Grandmother   . Hypertension Maternal Grandfather   . Heart disease Maternal Grandfather   . Diabetes Maternal Grandfather   . Anesthesia problems Neg Hx     Social History   Tobacco Use  . Smoking status: Never Smoker  . Smokeless tobacco: Never Used  Substance Use Topics  . Alcohol use: No  . Drug use: No    Home Medications Prior to Admission medications   Medication Sig Start Date End Date Taking? Authorizing Provider  aspirin-acetaminophen-caffeine (EXCEDRIN MIGRAINE) [phone] MG tablet Take by mouth every 6 (six) hours as needed for headache.    [provider]  carvedilol (COREG) 3.125 MG tablet Take 1 tablet (3.125 mg total) by mouth 2 (two) times daily. 11/22/19 02/20/20  Hilty, Lisette Abu, MD  olmesartan-hydrochlorothiazide (BENICAR HCT) 40-25 MG tablet Take 1 tablet by mouth daily. 09/04/19  Hilty, Lisette AbuKenneth C, MD  SHAROBEL 0.35 MG tablet Take 1 tablet by mouth daily. 12/06/15   [provider]    Allergies    Iron and Lisinopril  Review of Systems   Review of Systems  Genitourinary: Positive for flank pain.  All other systems reviewed and are negative.   Physical Exam Updated Vital Signs BP (!) 140/99 (BP Location: Right Arm)   Pulse 83   Temp 98.1 F (36.7 C) (Oral)   Resp 18   Ht 5\' 9"  (1.753 m)   Wt 108.9 kg   SpO2 98%   BMI 35.44 kg/m   Physical Exam Vitals and nursing note reviewed. Exam conducted with a chaperone present.  Constitutional:      General: She is not in acute distress.    Appearance: Normal appearance. She is well-developed and well-nourished.  HENT:     Head: Normocephalic and atraumatic.  Eyes:     Extraocular  Movements: EOM normal.     Pupils: Pupils are equal, round, and reactive to light.  Cardiovascular:     Rate and Rhythm: Normal rate and regular rhythm.     Pulses: Intact distal pulses.     Heart sounds: Normal heart sounds. No murmur heard. No friction rub.  Pulmonary:     Effort: Pulmonary effort is normal.     Breath sounds: Normal breath sounds. No wheezing or rales.  Abdominal:     General: Bowel sounds are normal. There is no distension.     Palpations: Abdomen is soft.     Tenderness: There is no abdominal tenderness. There is right CVA tenderness. There is no guarding or rebound.  Genitourinary:    Vagina: Normal. No vaginal discharge or tenderness.     Cervix: No discharge or cervical bleeding.     Uterus: Normal.      Adnexa: Left adnexa normal.       Right: Tenderness present. No mass or fullness.         Left: No mass, tenderness or fullness.    Musculoskeletal:        General: No tenderness. Normal range of motion.     Comments: No edema  Skin:    General: Skin is warm and dry.     Findings: No rash.  Neurological:     Mental Status: She is alert and oriented to person, place, and time.     Cranial Nerves: No cranial nerve deficit.  Psychiatric:        Mood and Affect: Mood and affect normal.        Behavior: Behavior normal.     ED Results / Procedures / Treatments   Labs (all labs ordered are listed, but only abnormal results are displayed) Labs Reviewed  WET PREP, GENITAL - Abnormal; Notable for the following components:      Result Value   WBC, Wet Prep HPF POC MANY (*)    All other components within normal limits  BASIC METABOLIC PANEL - Abnormal; Notable for the following components:   Glucose, Bld 100 (*)    All other components within normal limits  CBC WITH DIFFERENTIAL/PLATELET  URINALYSIS, ROUTINE W REFLEX MICROSCOPIC  PREGNANCY, URINE  GC/CHLAMYDIA PROBE AMP ([place]) NOT AT [facility]    EKG None  Radiology CT Renal Stone  Study  Result Date: 12/25/2020 CLINICAL DATA:  Acute flank pain. EXAM: CT ABDOMEN AND PELVIS WITHOUT CONTRAST TECHNIQUE: Multidetector CT imaging of the abdomen and pelvis was performed following the standard protocol without  IV contrast. COMPARISON:  September 28, 2019. FINDINGS: Lower chest: No acute abnormality. Hepatobiliary: No focal liver abnormality is seen. No gallstones, gallbladder wall thickening, or biliary dilatation. Pancreas: Unremarkable. No pancreatic ductal dilatation or surrounding inflammatory changes. Spleen: Normal in size without focal abnormality. Adrenals/Urinary Tract: Adrenal glands are unremarkable. Kidneys are normal, without renal calculi, focal lesion, or hydronephrosis. Bladder is unremarkable. Stomach/Bowel: Stomach is within normal limits. Appendix appears normal. No evidence of bowel wall thickening, distention, or inflammatory changes. Vascular/Lymphatic: No significant vascular findings are present. No enlarged abdominal or pelvic lymph nodes. Reproductive: Uterus and bilateral adnexa are unremarkable. Other: No abdominal wall hernia or abnormality. No abdominopelvic ascites. Musculoskeletal: No acute or significant osseous findings. IMPRESSION: No abnormality seen in the abdomen or pelvis. Electronically Signed   By: Lupita Raider M.D.   On: 12/25/2020 12:24    Procedures Procedures   Medications Ordered in ED Medications  acetaminophen (TYLENOL) tablet 1,000 mg (1,000 mg Oral Given 12/25/20 1041)    ED Course  I have reviewed the triage vital signs and the nursing notes.  Pertinent labs & imaging results that were available during my care of the patient were reviewed by me and considered in my medical decision making (see chart for details).    MDM Rules/Calculators/A&P                          Pt with symptoms consistent with kidney stone with prior stone requiring stenting 2 years ago.  Denies infectious sx, or GI symptoms.  Low concern for diverticulitis  and no risk factors or history suggestive of AAA.  No hx suggestive of GU source (discharge) and otherwise pt is healthy.  Will treat pain and ensure no infection with UA, CBC, BMP and will get stone study to further eval.  2:08 PM Labs including CBC, BMP and UA wnl.  CT without acute findings of stone or abdominal issues.  On repeat evaluation patient is still having pain.  She did see her OB/GYN last month and was diagnosed with bacterial vaginosis but symptoms had improved.  She has been sexually active with one partner for the last 20 years does not use protection but has very low risk for STI.  On pelvic exam she did have significant right adnexal tenderness.  We will do ultrasound to rule out any acute findings.  3:05 PM US shows a right ovarian cyst without complicating features.  This could be the cause of her pain given it is in the right location and no other acute findings.  Pt has no signs of torsion.  MDM Number of Diagnoses or Management Options   Amount and/or Complexity of Data Reviewed Clinical lab tests: ordered and reviewed Tests in the radiology section of CPT: ordered and reviewed Tests in the medicine section of CPT: ordered and reviewed Decide to obtain previous medical records or to obtain history from someone other than the patient: yes Obtain history from someone other than the patient: no Review and summarize past medical records: yes Discuss the patient with other providers: no Independent visualization of images, tracings, or specimens: yes  Risk of Complications, Morbidity, and/or Mortality Presenting problems: moderate Diagnostic procedures: low Management options: low  Patient Progress Patient progress: improved    Final Clinical Impression(s) / ED Diagnoses Final diagnoses:  Right ovarian cyst    Rx / DC Orders ED Discharge Orders         Ordered    HYDROcodone-acetaminophen (  NORCO/VICODIN) 5-325 MG tablet  Every 6 hours PRN        12/25/20  1503           Gwyneth Sprout, MD 12/25/20 1506

## 2020-12-25 NOTE — Discharge Instructions (Signed)
Blood work is normal today and CT without signs of kidney stone but your Ultrasound showed a right ovarian cyst.  This should just get better on its own but you can take the pain medication as needed for severe pain but if 2 Tylenol and 2 ibuprofen together every 6 hours keeps the pain manageable you do not need to take the stronger pain medication.  If you start having fever, vomiting or crippling pain please return for repeat evaluation.

## 2020-12-25 NOTE — ED Triage Notes (Signed)
Reports pain in right lower back that radiates around to right groin.  Hx of kidney stones on the other side.  Reports it feels the same.  Also reports urinary frequency and nausea.

## 2020-12-28 LAB — GC/CHLAMYDIA PROBE AMP (~~LOC~~) NOT AT ARMC
Chlamydia: NEGATIVE
Comment: NEGATIVE
Comment: NORMAL
Neisseria Gonorrhea: NEGATIVE

## 2021-01-18 ENCOUNTER — Emergency Department (HOSPITAL_BASED_OUTPATIENT_CLINIC_OR_DEPARTMENT_OTHER): Payer: 59

## 2021-01-18 ENCOUNTER — Encounter (HOSPITAL_BASED_OUTPATIENT_CLINIC_OR_DEPARTMENT_OTHER): Payer: Self-pay

## 2021-01-18 ENCOUNTER — Other Ambulatory Visit: Payer: Self-pay

## 2021-01-18 ENCOUNTER — Emergency Department (HOSPITAL_BASED_OUTPATIENT_CLINIC_OR_DEPARTMENT_OTHER)
Admission: EM | Admit: 2021-01-18 | Discharge: 2021-01-18 | Disposition: A | Discharge status: Home / Self Care | Payer: 59 | Attending: Emergency Medicine | Admitting: Emergency Medicine

## 2021-01-18 DIAGNOSIS — R109 Unspecified abdominal pain: Secondary | ICD-10-CM | POA: Diagnosis present

## 2021-01-18 DIAGNOSIS — Z79899 Other long term (current) drug therapy: Secondary | ICD-10-CM | POA: Diagnosis not present

## 2021-01-18 DIAGNOSIS — Z7982 Long term (current) use of aspirin: Secondary | ICD-10-CM | POA: Insufficient documentation

## 2021-01-18 DIAGNOSIS — I10 Essential (primary) hypertension: Secondary | ICD-10-CM | POA: Insufficient documentation

## 2021-01-18 DIAGNOSIS — K5730 Diverticulosis of large intestine without perforation or abscess without bleeding: Secondary | ICD-10-CM | POA: Diagnosis not present

## 2021-01-18 DIAGNOSIS — K5790 Diverticulosis of intestine, part unspecified, without perforation or abscess without bleeding: Secondary | ICD-10-CM

## 2021-01-18 LAB — URINALYSIS, ROUTINE W REFLEX MICROSCOPIC
Bilirubin Urine: NEGATIVE
Glucose, UA: NEGATIVE mg/dL
Hgb urine dipstick: NEGATIVE
Ketones, ur: NEGATIVE mg/dL
Leukocytes,Ua: NEGATIVE
Nitrite: NEGATIVE
Protein, ur: NEGATIVE mg/dL
Specific Gravity, Urine: 1.025 (ref 1.005–1.030)
pH: 6.5 (ref 5.0–8.0)

## 2021-01-18 LAB — COMPREHENSIVE METABOLIC PANEL
ALT: 14 U/L (ref 0–44)
AST: 12 U/L — ABNORMAL LOW (ref 15–41)
Albumin: 3.8 g/dL (ref 3.5–5.0)
Alkaline Phosphatase: 67 U/L (ref 38–126)
Anion gap: 6 (ref 5–15)
BUN: 8 mg/dL (ref 6–20)
CO2: 25 mmol/L (ref 22–32)
Calcium: 8.8 mg/dL — ABNORMAL LOW (ref 8.9–10.3)
Chloride: 105 mmol/L (ref 98–111)
Creatinine, Ser: 0.73 mg/dL (ref 0.44–1.00)
GFR, Estimated: >60 mL/min (ref >60)
Glucose, Bld: 91 mg/dL (ref 70–99)
Potassium: 3.8 mmol/L (ref 3.5–5.1)
Sodium: 136 mmol/L (ref 135–145)
Total Bilirubin: 0.4 mg/dL (ref 0.3–1.2)
Total Protein: 7.5 g/dL (ref 6.5–8.1)

## 2021-01-18 LAB — CBC
HCT: 37.2 % (ref 36.0–46.0)
Hemoglobin: 12.4 g/dL (ref 12.0–15.0)
MCH: 28.7 pg (ref 26.0–34.0)
MCHC: 33.3 g/dL (ref 30.0–36.0)
MCV: 86.1 fL (ref 80.0–100.0)
Platelets: 229 10*3/uL (ref 150–400)
RBC: 4.32 MIL/uL (ref 3.87–5.11)
RDW: 13.7 % (ref 11.5–15.5)
WBC: 4.8 10*3/uL (ref 4.0–10.5)
nRBC: 0 % (ref 0.0–0.2)

## 2021-01-18 LAB — PREGNANCY, URINE: Preg Test, Ur: NEGATIVE

## 2021-01-18 MED ORDER — IOHEXOL 300 MG/ML  SOLN
100.0000 mL | Freq: Once | INTRAMUSCULAR | Status: AC | PRN
  Administered 2021-01-18: 100 mL via INTRAVENOUS

## 2021-01-18 NOTE — ED Provider Notes (Signed)
[facility] EMERGENCY DEPARTMENT Provider Note   CSN: [id number] Arrival date & time: 01/18/21  1023     History Chief Complaint  Patient presents with  . Abdominal Pain    [name] Guerra is a 40 y.o. female.  HPI Patient is a 40 year old female with a history of kidney stones, gestational diabetes, diverticulitis, who presents the emergency department due to left-sided abdominal pain.  Her symptoms started about 6 days ago.  She states prior to the onset of her symptoms that she had been experiencing green stools.  She states that her left abdomen started hurting and she began experiencing intermittent diarrhea and constipation.  Her last bowel movement was about 2 days ago and was normal at that time.  She states that she followed up with her gastroenterologist who started her on Augmentin due to her history of diverticulitis.  She has taken 8 doses of Augmentin.  Her last dose was this morning.  She denies any improvement in her symptoms.  She called her gastroenterologist office and they told her to come to the emergency department to have a CT scan obtained for her probable diverticulitis.  She reports associated nausea without vomiting.  Denies any chest pain or shortness of breath.  No urinary symptoms.    Past Medical History:  Diagnosis Date  . Depression    POST PARTUM  . Diverticulitis   . Dysrhythmia   . Gestational diabetes    diet controlled  . Hx of Bell's palsy    as newborn  . Kidney stone   . Lymphadenitis    LEFT LOWER LEG  . SVD (spontaneous vaginal delivery) 05/24/2012    Patient Active Problem List   Diagnosis Date Noted  . Acute mastitis of right breast 10/10/2015  . Pregnancy induced hypertension 09/29/2015  . NSVD (normal spontaneous vaginal delivery) 09/29/2015  . HTN (hypertension) 05/01/2014  . Palpitations 05/01/2014  . Murmur 05/01/2014  . DOE (dyspnea on exertion) 05/01/2014  . Chest pain 05/01/2014    Past Surgical History:   Procedure Laterality Date  . BREAST CYST EXCISION    . LAPAROSCOPIC TUBAL LIGATION Bilateral 01/15/2016   Procedure: LAPAROSCOPIC TUBAL LIGATION;  Surgeon: Huel Cote, MD;  Location: [facility];  Service: Gynecology;  Laterality: Bilateral;  . TRANSTHORACIC ECHOCARDIOGRAM  02/09/2012   EF=55%, Patent foramen ovale is present, Doppler suggest left to right interatrial shunt  . WISDOM TOOTH EXTRACTION       OB History    Gravida  8   Para  6   Term  6   Preterm      AB  2   Living  6     SAB  2   IAB      Ectopic      Multiple  0   Live Births  6           Family History  Problem Relation Age of Onset  . Diabetes Mother   . Heart disease Maternal Aunt   . Diabetes Maternal Aunt   . Diabetes Maternal Uncle   . Cancer Maternal Uncle        bone  . Diabetes Maternal Grandmother   . Hypertension Maternal Grandfather   . Heart disease Maternal Grandfather   . Diabetes Maternal Grandfather   . Anesthesia problems Neg Hx     Social History   Tobacco Use  . Smoking status: Never Smoker  . Smokeless tobacco: Never Used  Vaping Use  . Vaping Use:  Never used  Substance Use Topics  . Alcohol use: No  . Drug use: No    Home Medications Prior to Admission medications   Medication Sig Start Date End Date Taking? Authorizing Provider  carvedilol (COREG) 3.125 MG tablet Take 1 tablet (3.125 mg total) by mouth 2 (two) times daily. 11/22/19 02/20/20 Yes Hilty, Lisette AbuKenneth C, MD  olmesartan-hydrochlorothiazide (BENICAR HCT) 40-25 MG tablet Take 1 tablet by mouth daily. 09/04/19  Yes Hilty, Lisette AbuKenneth C, MD  aspirin-acetaminophen-caffeine (EXCEDRIN MIGRAINE) [phone] MG tablet Take by mouth every 6 (six) hours as needed for headache.    [provider]  HYDROcodone-acetaminophen (NORCO/VICODIN) 5-325 MG tablet Take 1 tablet by mouth every 6 (six) hours as needed. 12/25/20   Gwyneth SproutPlunkett, Whitney, MD  SHAROBEL 0.35 MG tablet Take 1 tablet by mouth daily. 12/06/15    [provider]    Allergies    Iron and Lisinopril  Review of Systems   Review of Systems  All other systems reviewed and are negative. Ten systems reviewed and are negative for acute change, except as noted in the HPI.   Physical Exam Updated Vital Signs BP 132/89 (BP Location: Right Arm)   Pulse 93   Temp 98.6 F (37 C) (Oral)   Resp 19   Ht 5\' 8"  (1.727 m)   Wt 107.8 kg   SpO2 100%   BMI 36.14 kg/m   Physical Exam Vitals and nursing note reviewed.  Constitutional:      General: She is not in acute distress.    Appearance: Normal appearance. She is well-developed. She is not ill-appearing, toxic-appearing or diaphoretic.  HENT:     Head: Normocephalic and atraumatic.     Right Ear: External ear normal.     Left Ear: External ear normal.     Nose: Nose normal.     Mouth/Throat:     Mouth: Mucous membranes are moist.     Pharynx: Oropharynx is clear. No oropharyngeal exudate or posterior oropharyngeal erythema.  Eyes:     Extraocular Movements: Extraocular movements intact.  Cardiovascular:     Rate and Rhythm: Normal rate and regular rhythm.     Pulses: Normal pulses.     Heart sounds: Normal heart sounds. No murmur heard. No friction rub. No gallop.   Pulmonary:     Effort: Pulmonary effort is normal. No respiratory distress.     Breath sounds: Normal breath sounds. No stridor. No wheezing, rhonchi or rales.  Abdominal:     General: Abdomen is flat and protuberant.     Tenderness: There is abdominal tenderness.     Comments: Protuberant abdomen that is soft.  Moderate tenderness noted along the central left lateral abdomen.  No left lower quadrant tenderness.  Negative Murphy sign.  No McBurney's point tenderness.  Musculoskeletal:        General: Normal range of motion.     Cervical back: Normal range of motion and neck supple. No tenderness.  Skin:    General: Skin is warm and dry.  Neurological:     General: No focal deficit present.     Mental  Status: She is alert and oriented to person, place, and time.  Psychiatric:        Mood and Affect: Mood normal.        Behavior: Behavior normal.    ED Results / Procedures / Treatments   Labs (all labs ordered are listed, but only abnormal results are displayed) Labs Reviewed  COMPREHENSIVE METABOLIC PANEL - Abnormal;  Notable for the following components:      Result Value   Calcium 8.8 (*)    AST 12 (*)    All other components within normal limits  URINALYSIS, ROUTINE W REFLEX MICROSCOPIC - Abnormal; Notable for the following components:   Color, Urine AMBER (*)    APPearance CLOUDY (*)    All other components within normal limits  CBC  PREGNANCY, URINE    EKG None  Radiology CT ABDOMEN PELVIS W CONTRAST  Result Date: 01/18/2021 CLINICAL DATA:  LEFT lower quadrant abdominal pain, history of diverticulitis in a 40 year old female. EXAM: CT ABDOMEN AND PELVIS WITH CONTRAST TECHNIQUE: Multidetector CT imaging of the abdomen and pelvis was performed using the standard protocol following bolus administration of intravenous contrast. CONTRAST:  OMNIPAQUE IOHEXOL 300 MG/ML  SOLN COMPARISON:  December 25, 2020 FINDINGS: Lower chest: Incidental imaging of the lung bases without effusion. No sign of consolidative changes. Hepatobiliary: Hepatic steatosis. No focal, suspicious hepatic lesion. Portal vein is patent. No pericholecystic stranding or biliary duct distension. Pancreas: Normal, without mass, inflammation or ductal dilatation. Spleen: Spleen normal size and contour. Adrenals/Urinary Tract: Adrenal glands are normal. Symmetric renal enhancement. No hydronephrosis. Urinary bladder with smooth contours and with under distension. Stomach/Bowel: No acute gastrointestinal process. Small bowel nondilated and without adjacent stranding. The appendix is normal. Fluid-filled colon without distension. Signs of colonic diverticulosis without evidence of diverticulitis. Vascular/Lymphatic: Normal  caliber abdominal aorta. Smooth contour the IVC. There is no gastrohepatic or hepatoduodenal ligament lymphadenopathy. No retroperitoneal or mesenteric lymphadenopathy. No pelvic sidewall lymphadenopathy. Reproductive: Small amount of free fluid in the pelvis, nonspecific and likely physiologic. Unremarkable CT appearance of uterus and adnexal structures Other: Trace free fluid in the pelvis as described. Musculoskeletal: No acute bone finding or destructive bone process. Sclerosis of bilateral sacroiliac joints is similar to previous imaging IMPRESSION: 1. Signs of colonic diverticulosis without evidence of diverticulitis. 2. Normal appendix. 3. Normal gallbladder. 4. 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 5. Hepatic steatosis. Electronically Signed   By: Donzetta Kohut M.D.   On: 01/18/2021 13:38   Procedures Procedures   Medications Ordered in ED Medications  iohexol (OMNIPAQUE) 300 MG/ML solution 100 mL (100 mLs Intravenous Contrast Given 01/18/21 1308)    ED Course  I have reviewed the triage vital signs and the nursing notes.  Pertinent labs & imaging results that were available during my care of the patient were reviewed by me and considered in my medical decision making (see chart for details).    MDM Rules/Calculators/A&P                          Pt is a 40 y.o. female with a history of diverticulitis who presents the emergency department with left-sided abdominal pain.  Labs: CBC without abnormalities. CMP with a calcium of 8.8 and an AST of 12. Pregnancy test negative. UA negative.  Imaging: CT scan of the abdomen and pelvis shows signs of colonic diverticulosis without evidence of diverticulitis.  Normal appendix.  Normal gallbladder.  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  I, Placido Sou, PA-C, personally reviewed and evaluated these images and lab results as  part of my medical decision-making.  Unsure of the cause of the patient's abdominal pain.  No signs of diverticulitis on her CT today.  Lab work is reassuring.  No leukocytosis.  Patient is afebrile.  Doubt infectious source.  Patient  has follow-up with GI.  Urged her to make sure that she contacts them and get an appointment as soon as possible to be reevaluated.  Recommended that she return to the emergency department if her symptoms worsen.  Her questions were answered and she was amicable at the time of discharge.  Note: Portions of this report may have been transcribed using voice recognition software. Every effort was made to ensure accuracy; however, inadvertent computerized transcription errors may be present.   Final Clinical Impression(s) / ED Diagnoses Final diagnoses:  Abdominal pain, unspecified abdominal location  Diverticulosis   Rx / DC Orders ED Discharge Orders    None       Placido Sou, PA-C 01/18/21 1436    Gwyneth Sprout, MD 01/19/21 1336

## 2021-01-18 NOTE — ED Triage Notes (Signed)
L sided middle abd pain x 1 week.  Dx with "probable diverticulitis" per her PCP and was told to get a CT scan if sxs worsen.  Pt could not get an available apt today at PCP.  C/O Nausea.

## 2021-01-18 NOTE — Discharge Instructions (Signed)
I would take MiraLAX as needed for management of your intermittent constipation.  If your symptoms worsen, you can always return to the emergency department.  Otherwise, please follow-up with your gastroenterologist as soon as possible.  It was a pleasure to meet you.

## 2021-04-04 ENCOUNTER — Other Ambulatory Visit: Payer: Self-pay

## 2021-04-04 ENCOUNTER — Emergency Department (HOSPITAL_BASED_OUTPATIENT_CLINIC_OR_DEPARTMENT_OTHER): Payer: 59

## 2021-04-04 ENCOUNTER — Encounter (HOSPITAL_BASED_OUTPATIENT_CLINIC_OR_DEPARTMENT_OTHER): Payer: Self-pay | Admitting: *Deleted

## 2021-04-04 ENCOUNTER — Emergency Department (HOSPITAL_BASED_OUTPATIENT_CLINIC_OR_DEPARTMENT_OTHER)
Admission: EM | Admit: 2021-04-04 | Discharge: 2021-04-04 | Disposition: A | Discharge status: Home / Self Care | Payer: 59 | Attending: Emergency Medicine | Admitting: Emergency Medicine

## 2021-04-04 DIAGNOSIS — S6992XA Unspecified injury of left wrist, hand and finger(s), initial encounter: Secondary | ICD-10-CM | POA: Diagnosis not present

## 2021-04-04 DIAGNOSIS — W2103XA Struck by baseball, initial encounter: Secondary | ICD-10-CM | POA: Diagnosis not present

## 2021-04-04 DIAGNOSIS — Y9367 Activity, basketball: Secondary | ICD-10-CM | POA: Diagnosis not present

## 2021-04-04 DIAGNOSIS — Z79899 Other long term (current) drug therapy: Secondary | ICD-10-CM | POA: Diagnosis not present

## 2021-04-04 DIAGNOSIS — I10 Essential (primary) hypertension: Secondary | ICD-10-CM | POA: Insufficient documentation

## 2021-04-04 NOTE — ED Triage Notes (Signed)
Pt reports she hit her left ring finger playing basketball last night. Swelling noted

## 2021-04-04 NOTE — Discharge Instructions (Addendum)
Your x-ray today showing likely small avulsion fracture.  You were placed on a splint to help with discomfort.  You may continue taking your prescribed Aleve to help with pain control.  The number to Dr. Jordan Likes is attached to your chart should you want to follow-up with the sports medicine specialist.

## 2021-04-04 NOTE — ED Provider Notes (Signed)
[facility] EMERGENCY DEPARTMENT Provider Note   CSN: [id number] Arrival date & time: 04/04/21  1327     History Chief Complaint  Patient presents with   Finger Injury    [name] Guerra is a 40 y.o. female.  40 y.o female with a PMH Depression presents to the ED with a complaint of left hand pain status post basketball game last night.  Patient reports she was with children, approximately 73 year olds and others playing basketball, felt that the ball when she went to try to get it jammed her left ring finger.  There is swelling to the area, she removed her wedding band last night.  There is been worsening pain along with decrease in range of motion.  Pain is exacerbated with movement.  Has not taken any medication for improvement in symptoms.  No other injuries noted.  The history is provided by the patient.      Past Medical History:  Diagnosis Date   Depression    POST PARTUM   Diverticulitis    Dysrhythmia    Gestational diabetes    diet controlled   Hx of Bell's palsy    as newborn   Kidney stone    Lymphadenitis    LEFT LOWER LEG   SVD (spontaneous vaginal delivery) 05/24/2012    Patient Active Problem List   Diagnosis Date Noted   Acute mastitis of right breast 10/10/2015   Pregnancy induced hypertension 09/29/2015   NSVD (normal spontaneous vaginal delivery) 09/29/2015   HTN (hypertension) 05/01/2014   Palpitations 05/01/2014   Murmur 05/01/2014   DOE (dyspnea on exertion) 05/01/2014   Chest pain 05/01/2014    Past Surgical History:  Procedure Laterality Date   BREAST CYST EXCISION     LAPAROSCOPIC TUBAL LIGATION Bilateral 01/15/2016   Procedure: LAPAROSCOPIC TUBAL LIGATION;  Surgeon: Huel Cote, MD;  Location: [facility];  Service: Gynecology;  Laterality: Bilateral;   TRANSTHORACIC ECHOCARDIOGRAM  02/09/2012   EF=55%, Patent foramen ovale is present, Doppler suggest left to right interatrial shunt   WISDOM TOOTH EXTRACTION       OB  History     Gravida  8   Para  6   Term  6   Preterm      AB  2   Living  6      SAB  2   IAB      Ectopic      Multiple  0   Live Births  6           Family History  Problem Relation Age of Onset   Diabetes Mother    Heart disease Maternal Aunt    Diabetes Maternal Aunt    Diabetes Maternal Uncle    Cancer Maternal Uncle        bone   Diabetes Maternal Grandmother    Hypertension Maternal Grandfather    Heart disease Maternal Grandfather    Diabetes Maternal Grandfather    Anesthesia problems Neg Hx     Social History   Tobacco Use   Smoking status: Never   Smokeless tobacco: Never  Vaping Use   Vaping Use: Never used  Substance Use Topics   Alcohol use: Yes    Comment: occasional   Drug use: No    Home Medications Prior to Admission medications   Medication Sig Start Date End Date Taking? Authorizing Provider  aspirin-acetaminophen-caffeine (EXCEDRIN MIGRAINE) [phone] MG tablet Take by mouth every 6 (six) hours as needed for headache.  [provider]  carvedilol (COREG) 3.125 MG tablet Take 1 tablet (3.125 mg total) by mouth 2 (two) times daily. 11/22/19 02/20/20  Chrystie Nose, MD  HYDROcodone-acetaminophen (NORCO/VICODIN) 5-325 MG tablet Take 1 tablet by mouth every 6 (six) hours as needed. 12/25/20   Gwyneth Sprout, MD  HYDROcodone-acetaminophen (NORCO/VICODIN) 5-325 MG tablet TAKE 1 TABLET BY MOUTH EVERY 6 (SIX) HOURS AS NEEDED. 12/25/20 06/23/21  Gwyneth Sprout, MD  olmesartan-hydrochlorothiazide (BENICAR HCT) 40-25 MG tablet Take 1 tablet by mouth daily. 09/04/19   Hilty, Lisette Abu, MD  SHAROBEL 0.35 MG tablet Take 1 tablet by mouth daily. 12/06/15   [provider]    Allergies    Iron and Lisinopril  Review of Systems   Review of Systems  Constitutional:  Negative for chills and fever.  HENT:  Negative for sinus pressure.   Respiratory:  Negative for shortness of breath.   Cardiovascular:  Negative for  chest pain.  Gastrointestinal:  Negative for abdominal pain.  Musculoskeletal:  Positive for arthralgias.  All other systems reviewed and are negative.  Physical Exam Updated Vital Signs BP 132/85 (BP Location: Right Arm)   Pulse 92   Temp 98.6 F (37 C) (Oral)   Resp 18   Ht 5\' 9"  (1.753 m)   Wt 103.9 kg   SpO2 98%   BMI 33.82 kg/m   Physical Exam Vitals and nursing note reviewed.  Constitutional:      Appearance: Normal appearance.  HENT:     Head: Normocephalic and atraumatic.  Cardiovascular:     Rate and Rhythm: Normal rate.  Pulmonary:     Effort: Pulmonary effort is normal.  Abdominal:     General: Abdomen is flat.  Musculoskeletal:     Left hand: Swelling and tenderness present. No deformity, lacerations or bony tenderness. Normal range of motion. Normal strength. Normal sensation. There is no disruption of two-point discrimination. Normal capillary refill. Normal pulse.     Cervical back: Normal range of motion and neck supple.  Skin:    General: Skin is warm and dry.  Neurological:     Mental Status: She is alert and oriented to person, place, and time.    ED Results / Procedures / Treatments   Labs (all labs ordered are listed, but only abnormal results are displayed) Labs Reviewed - No data to display  EKG None  Radiology DG Finger Ring Left  Result Date: 04/04/2021 CLINICAL DATA:  Jammed left ring finger playing basketball last night. EXAM: LEFT RING FINGER 2+V COMPARISON:  None. FINDINGS: Punctate density dorsal to the fourth PIP joint on the lateral view may represent a tiny avulsion fracture. No dislocation. Joint spaces are preserved. Soft tissue swelling of the ring finger. IMPRESSION: 1. Punctate density dorsal to the fourth PIP joint, possibly a tiny avulsion fracture. Electronically Signed   By: 06/04/2021 M.D.   On: 04/04/2021 14:16    Procedures Procedures   Medications Ordered in ED Medications - No data to display  ED Course  I  have reviewed the triage vital signs and the nursing notes.  Pertinent labs & imaging results that were available during my care of the patient were reviewed by me and considered in my medical decision making (see chart for details).    MDM Rules/Calculators/A&P     Patient presents to the ED with a chief complaint of left hand pain s/p injury x yesterday. Patient was playing basketball with kids when she went up for the rebound and  states the left ringer finger struck the ball. There is pain to the area along with swelling and discomfort.   During evaluation swelling noted to left ring finger, no erythema or abrasions/laceration noted. Pulses are present and she is neurovascularly intact. There is good capillary refill and full extension and flexion. We discussed NSAIDS, RICE therapy along with finger splint to help with symptomatic treatment. Xray is positive for small avulsion fracture. Provided with outpatient hand specialist on call. Return precautions discussed at length.    Portions of this note were generated with Scientist, clinical (histocompatibility and immunogenetics). Dictation errors may occur despite best attempts at proofreading.  Final Clinical Impression(s) / ED Diagnoses Final diagnoses:  Injury of finger of left hand, initial encounter    Rx / DC Orders ED Discharge Orders     None        Claude Manges, PA-C 04/04/21 1450    Pricilla Loveless, MD 04/05/21 [phone]

## 2021-04-09 ENCOUNTER — Ambulatory Visit: Payer: Self-pay

## 2021-04-09 ENCOUNTER — Encounter: Payer: Self-pay | Admitting: Family Medicine

## 2021-04-09 ENCOUNTER — Ambulatory Visit: Payer: 59 | Admitting: Family Medicine

## 2021-04-09 ENCOUNTER — Other Ambulatory Visit: Payer: Self-pay

## 2021-04-09 VITALS — BP 108/82 | Ht 69.0 in | Wt 229.0 lb

## 2021-04-09 DIAGNOSIS — S63635A Sprain of interphalangeal joint of left ring finger, initial encounter: Secondary | ICD-10-CM | POA: Diagnosis not present

## 2021-04-09 DIAGNOSIS — S63633A Sprain of interphalangeal joint of left middle finger, initial encounter: Secondary | ICD-10-CM

## 2021-04-09 NOTE — Assessment & Plan Note (Signed)
Injury occurred on 6/11.  The PIP joint appears to be sprained.  No instability appreciated on exam. -Counseled on home exercise therapy and supportive care. -Counseled on buddy taping. -Could consider OT. -Follow-up in 2 weeks.

## 2021-04-09 NOTE — Patient Instructions (Signed)
Nice to meet you Please try buddy taping  Please try ice  Please try the exercises   Please send me a message in MyChart with any questions or updates.  Please see me back in 2 weeks.   --Dr. Jordan Likes

## 2021-04-09 NOTE — Progress Notes (Signed)
AMIYLAH Guerra - 40 y.o. female MRN [id number]  Date of birth: May 24, 1981  SUBJECTIVE:  Including CC & ROS.  No chief complaint on file.   [name] Guerra is a 40 y.o. female that is presenting with left ring finger pain.  She was playing basketball and [place] hit the tip of the finger.  Happened about a week ago.  She is having some pain at the PIP joint.  Has been in a splint since that time..  Independent review of the left ring finger x-ray from 6/12 shows possible density over the fourth PIP joint.   Review of Systems See HPI   HISTORY: Past Medical, Surgical, Social, and Family History Reviewed & Updated per EMR.   Pertinent Historical Findings include:  Past Medical History:  Diagnosis Date   Depression    POST PARTUM   Diverticulitis    Dysrhythmia    Gestational diabetes    diet controlled   Hx of Bell's palsy    as newborn   Kidney stone    Lymphadenitis    LEFT LOWER LEG   SVD (spontaneous vaginal delivery) 05/24/2012    Past Surgical History:  Procedure Laterality Date   BREAST CYST EXCISION     LAPAROSCOPIC TUBAL LIGATION Bilateral 01/15/2016   Procedure: LAPAROSCOPIC TUBAL LIGATION;  Surgeon: Huel Cote, MD;  Location: [facility];  Service: Gynecology;  Laterality: Bilateral;   TRANSTHORACIC ECHOCARDIOGRAM  02/09/2012   EF=55%, Patent foramen ovale is present, Doppler suggest left to right interatrial shunt   WISDOM TOOTH EXTRACTION      Family History  Problem Relation Age of Onset   Diabetes Mother    Heart disease Maternal Aunt    Diabetes Maternal Aunt    Diabetes Maternal Uncle    Cancer Maternal Uncle        bone   Diabetes Maternal Grandmother    Hypertension Maternal Grandfather    Heart disease Maternal Grandfather    Diabetes Maternal Grandfather    Anesthesia problems Neg Hx     Social History   Socioeconomic History   Marital status: Married    Spouse name: Not on file   Number of children: Not on file   Years of  education: Not on file   Highest education level: Not on file  Occupational History   Not on file  Tobacco Use   Smoking status: Never   Smokeless tobacco: Never  Vaping Use   Vaping Use: Never used  Substance and Sexual Activity   Alcohol use: Yes    Comment: occasional   Drug use: No   Sexual activity: Yes    Birth control/protection: Surgical  Other Topics Concern   Not on file  Social History Narrative   Not on file   Social Determinants of Health   Financial Resource Strain: Not on file  Food Insecurity: Not on file  Transportation Needs: Not on file  Physical Activity: Not on file  Stress: Not on file  Social Connections: Not on file  Intimate Partner Violence: Not on file     PHYSICAL EXAM:  VS: BP 108/82 (BP Location: Left Arm, Patient Position: Sitting, Cuff Size: Large)   Ht 5\' 9"  (1.753 m)   Wt 229 lb (103.9 kg)   BMI 33.82 kg/m  Physical Exam Gen: NAD, alert, cooperative with exam, well-appearing MSK:  Left ring finger. Normal passive range of motion. Pain with active flexion at the PIP joint. No significant swelling today. No mallet finger. Neurovascularly intact  Limited ultrasound: Left ring finger:  Normal-appearing MCP joint. Normal-appearing extensor tendon. Hyperechoic area observed that was seen on x-ray but no effusion surrounding this area. No tear appreciated of the capsule on dynamic testing  Summary: Findings consistent with sprain of the PIP joint.  Ultrasound and interpretation by Clare Gandy, MD    ASSESSMENT & PLAN:   Sprain of interphalangeal joint of left ring finger Injury occurred on 6/11.  The PIP joint appears to be sprained.  No instability appreciated on exam. -Counseled on home exercise therapy and supportive care. -Counseled on buddy taping. -Could consider OT. -Follow-up in 2 weeks.

## 2021-04-28 ENCOUNTER — Ambulatory Visit: Payer: Self-pay

## 2021-04-28 ENCOUNTER — Other Ambulatory Visit: Payer: Self-pay

## 2021-04-28 ENCOUNTER — Encounter: Payer: Self-pay | Admitting: Family Medicine

## 2021-04-28 ENCOUNTER — Ambulatory Visit: Payer: 59 | Admitting: Family Medicine

## 2021-04-28 DIAGNOSIS — S63635D Sprain of interphalangeal joint of left ring finger, subsequent encounter: Secondary | ICD-10-CM

## 2021-04-28 NOTE — Assessment & Plan Note (Addendum)
Appears to have acutely injured her same finger and joint.  Having an effusion observed on [place].  - counseled on home exercise therapy and supportive care - placed in splint  - counseled on buddy taping at night.  - could consider OT.

## 2021-04-28 NOTE — Progress Notes (Signed)
[name] Guerra - 40 y.o. female MRN [id number]  Date of birth: 1981/05/01  SUBJECTIVE:  Including CC & ROS.  No chief complaint on file.   [name] Guerra is a 41 y.o. female that is presenting with acutely worsening of her PIP joint of the left ring finger.  She hit her finger on the bottom of the pool.  Having worsening pain at this area.   Review of Systems See HPI   HISTORY: Past Medical, Surgical, Social, and Family History Reviewed & Updated per EMR.   Pertinent Historical Findings include:  Past Medical History:  Diagnosis Date   Depression    POST PARTUM   Diverticulitis    Dysrhythmia    Gestational diabetes    diet controlled   Hx of Bell's palsy    as newborn   Kidney stone    Lymphadenitis    LEFT LOWER LEG   SVD (spontaneous vaginal delivery) 05/24/2012    Past Surgical History:  Procedure Laterality Date   BREAST CYST EXCISION     LAPAROSCOPIC TUBAL LIGATION Bilateral 01/15/2016   Procedure: LAPAROSCOPIC TUBAL LIGATION;  Surgeon: Huel Cote, MD;  Location: [facility];  Service: Gynecology;  Laterality: Bilateral;   TRANSTHORACIC ECHOCARDIOGRAM  02/09/2012   EF=55%, Patent foramen ovale is present, Doppler suggest left to right interatrial shunt   WISDOM TOOTH EXTRACTION      Family History  Problem Relation Age of Onset   Diabetes Mother    Heart disease Maternal Aunt    Diabetes Maternal Aunt    Diabetes Maternal Uncle    Cancer Maternal Uncle        bone   Diabetes Maternal Grandmother    Hypertension Maternal Grandfather    Heart disease Maternal Grandfather    Diabetes Maternal Grandfather    Anesthesia problems Neg Hx     Social History   Socioeconomic History   Marital status: Married    Spouse name: Not on file   Number of children: Not on file   Years of education: Not on file   Highest education level: Not on file  Occupational History   Not on file  Tobacco Use   Smoking status: Never   Smokeless tobacco: Never  Vaping  Use   Vaping Use: Never used  Substance and Sexual Activity   Alcohol use: Yes    Comment: occasional   Drug use: No   Sexual activity: Yes    Birth control/protection: Surgical  Other Topics Concern   Not on file  Social History Narrative   Not on file   Social Determinants of Health   Financial Resource Strain: Not on file  Food Insecurity: Not on file  Transportation Needs: Not on file  Physical Activity: Not on file  Stress: Not on file  Social Connections: Not on file  Intimate Partner Violence: Not on file     PHYSICAL EXAM:  VS: BP 118/82 (BP Location: Left Arm, Patient Position: Sitting, Cuff Size: Large)   Ht 5\' 9"  (1.753 m)   Wt 229 lb (103.9 kg)   BMI 33.82 kg/m  Physical Exam Gen: NAD, alert, cooperative with exam, well-appearing MSK:  Left hand: No malrotation or misalignment. Limited flexion of the PIP joint of the ring finger. No instability. Neurovascular intact  Limited ultrasound: Left ring finger:  No changes of fourth MCP joint. Small effusion appreciated the PIP joint. No changes of the flexor tendon. There appears to be some swelling of the collaterals on the radial side  of the PIP joint.  Summary: Mild effusion of the PIP joint.  Ultrasound and interpretation by Clare Gandy, MD    ASSESSMENT & PLAN:   Sprain of interphalangeal joint of left ring finger Appears to have acutely injured her same finger and joint.  Having an effusion observed on [place].  - counseled on home exercise therapy and supportive care - placed in splint  - counseled on buddy taping at night.  - could consider OT.

## 2021-04-29 ENCOUNTER — Ambulatory Visit: Payer: 59 | Admitting: Family Medicine

## 2021-05-12 ENCOUNTER — Ambulatory Visit: Payer: 59 | Admitting: Family Medicine

## 2021-05-19 ENCOUNTER — Encounter: Payer: Self-pay | Admitting: Family Medicine

## 2021-05-19 ENCOUNTER — Ambulatory Visit: Payer: 59 | Admitting: Family Medicine

## 2021-05-19 ENCOUNTER — Other Ambulatory Visit: Payer: Self-pay

## 2021-05-19 ENCOUNTER — Ambulatory Visit: Payer: Self-pay

## 2021-05-19 VITALS — BP 108/88 | Ht 69.0 in | Wt 229.0 lb

## 2021-05-19 DIAGNOSIS — S63635D Sprain of interphalangeal joint of left ring finger, subsequent encounter: Secondary | ICD-10-CM

## 2021-05-19 NOTE — Patient Instructions (Signed)
Good to see you Please use ice as needed  Please try the exercises  Please buddy tape if needed   Please send me a message in MyChart with any questions or updates.  Please see [place] back as needed.   --Dr. Jordan Likes

## 2021-05-19 NOTE — Progress Notes (Signed)
[name] Guerra - 40 y.o. female MRN [id number]  Date of birth: 05-04-1981  SUBJECTIVE:  Including CC & ROS.  No chief complaint on file.   [name] Guerra is a 40 y.o. female that is following up for left finger pain.  Pain has been well with splinting.  She is having some stiffness with range of motion movement.  Pain has improved.    Review of Systems See HPI   HISTORY: Past Medical, Surgical, Social, and Family History Reviewed & Updated per EMR.   Pertinent Historical Findings include:  Past Medical History:  Diagnosis Date   Depression    POST PARTUM   Diverticulitis    Dysrhythmia    Gestational diabetes    diet controlled   Hx of Bell's palsy    as newborn   Kidney stone    Lymphadenitis    LEFT LOWER LEG   SVD (spontaneous vaginal delivery) 05/24/2012    Past Surgical History:  Procedure Laterality Date   BREAST CYST EXCISION     LAPAROSCOPIC TUBAL LIGATION Bilateral 01/15/2016   Procedure: LAPAROSCOPIC TUBAL LIGATION;  Surgeon: Huel Cote, MD;  Location: [facility];  Service: Gynecology;  Laterality: Bilateral;   TRANSTHORACIC ECHOCARDIOGRAM  02/09/2012   EF=55%, Patent foramen ovale is present, Doppler suggest left to right interatrial shunt   WISDOM TOOTH EXTRACTION      Family History  Problem Relation Age of Onset   Diabetes Mother    Heart disease Maternal Aunt    Diabetes Maternal Aunt    Diabetes Maternal Uncle    Cancer Maternal Uncle        bone   Diabetes Maternal Grandmother    Hypertension Maternal Grandfather    Heart disease Maternal Grandfather    Diabetes Maternal Grandfather    Anesthesia problems Neg Hx     Social History   Socioeconomic History   Marital status: Married    Spouse name: Not on file   Number of children: Not on file   Years of education: Not on file   Highest education level: Not on file  Occupational History   Not on file  Tobacco Use   Smoking status: Never   Smokeless tobacco: Never  Vaping Use    Vaping Use: Never used  Substance and Sexual Activity   Alcohol use: Yes    Comment: occasional   Drug use: No   Sexual activity: Yes    Birth control/protection: Surgical  Other Topics Concern   Not on file  Social History Narrative   Not on file   Social Determinants of Health   Financial Resource Strain: Not on file  Food Insecurity: Not on file  Transportation Needs: Not on file  Physical Activity: Not on file  Stress: Not on file  Social Connections: Not on file  Intimate Partner Violence: Not on file     PHYSICAL EXAM:  VS: BP 108/88 (BP Location: Left Arm, Patient Position: Sitting, Cuff Size: Large)   Ht 5\' 9"  (1.753 m)   Wt 229 lb (103.9 kg)   BMI 33.82 kg/m  Physical Exam Gen: NAD, alert, cooperative with exam, well-appearing MSK:  Left ring finger: No significant swelling at the PIP joint. Has good range of motion. Neurovascular intact  Limited ultrasound: Left ring finger:  Normal-appearing flexor tendon No change of the collateral bands or of the PIP joint.   Summary: No structural changes appreciated  Ultrasound and interpretation by , MD    ASSESSMENT & PLAN:  Sprain of interphalangeal joint of left ring finger Symptoms have been improving.  Reassuring ultrasound exam today.  Has some stiffness of the PIP joint. -Counseled on home exercise therapy and supportive care. -Counseled on buddy taping. -Provided work note.

## 2021-05-19 NOTE — Assessment & Plan Note (Signed)
Symptoms have been improving.  Reassuring ultrasound exam today.  Has some stiffness of the PIP joint. -Counseled on home exercise therapy and supportive care. -Counseled on buddy taping. -Provided work note.

## 2021-05-20 ENCOUNTER — Telehealth: Payer: Self-pay | Admitting: Family Medicine

## 2021-05-20 NOTE — Telephone Encounter (Signed)
Patient called ask for adjustment to RTW note --Employer wants to see if pt can return to work on 05/31/21 .  --Forwarding request to provider& or med asst for review.  -- Pt wishes to pick up Friday before closing @ 12:30.  --glh

## 2021-05-21 NOTE — Telephone Encounter (Signed)
Pt informed letter is upfront for p/u.

## 2021-08-19 IMAGING — CT CT ABD-PELV W/ CM
2 of 5 series · 16 of 46 positions shown, 18 images · IV contrast (Omnipaque)
Comparison: December 25, 2020

CLINICAL DATA: LEFT lower quadrant abdominal pain, history of
diverticulitis in a 40-year-old female.

EXAM:
CT ABDOMEN AND PELVIS WITH CONTRAST
TECHNIQUE: Multidetector CT imaging of the abdomen and pelvis was performed
using the standard protocol following bolus administration of
intravenous contrast.
CONTRAST:  100mL OMNIPAQUE IOHEXOL 300 MG/ML  SOLN

[Series 2: axial st · axial · 0.89mm/px · z∈[-588,-113]mm · 13 of 107 slices shown, 15 images]
[im 6/107  soft-tissue]
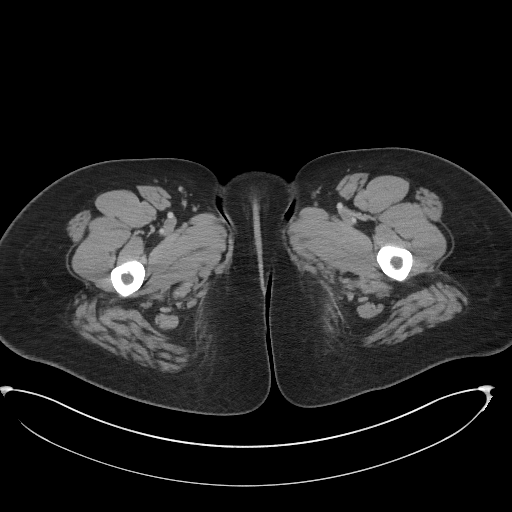
[im 6/107  bone]
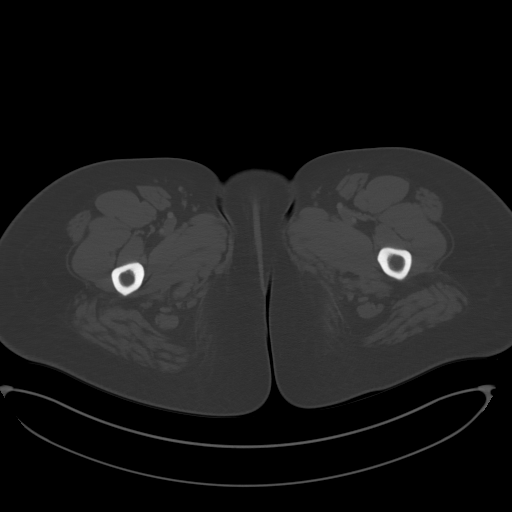
[im 17/107  soft-tissue]
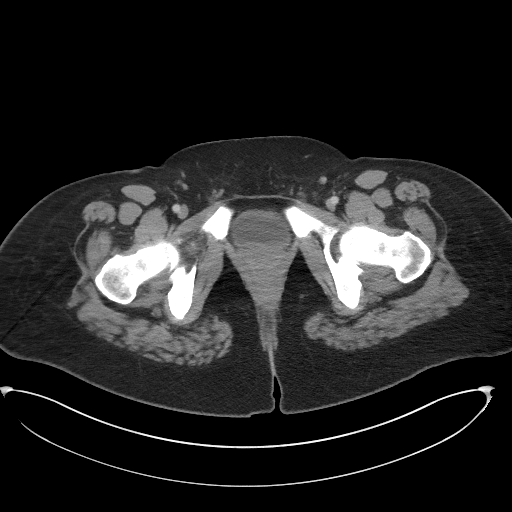
[im 23/107  soft-tissue]
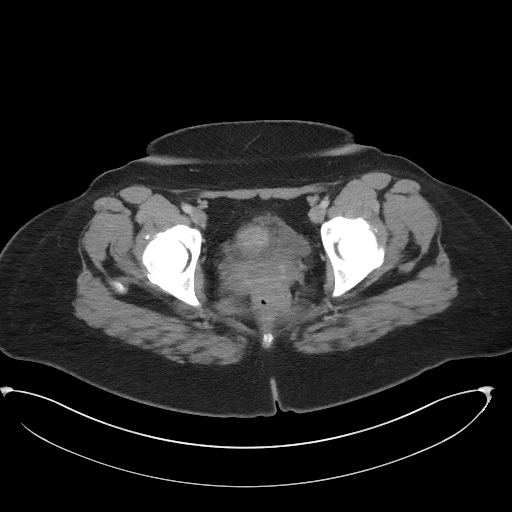
[im 28/107  soft-tissue]
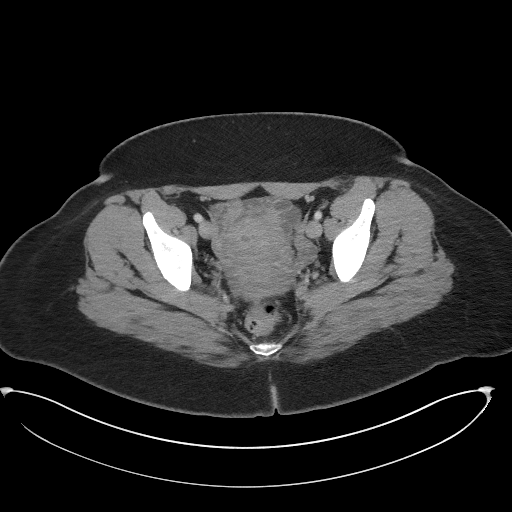
[im 40/107  soft-tissue]
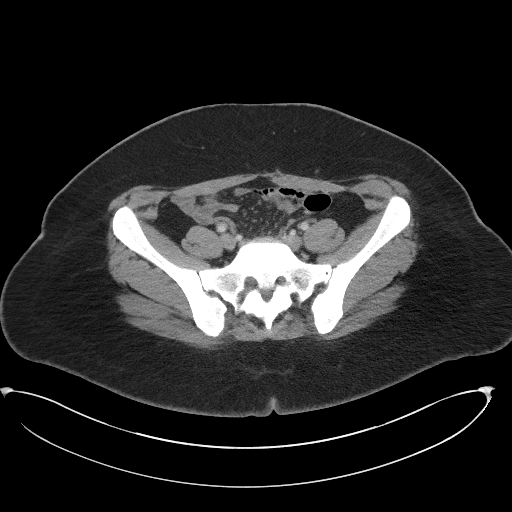
[im 45/107  soft-tissue]
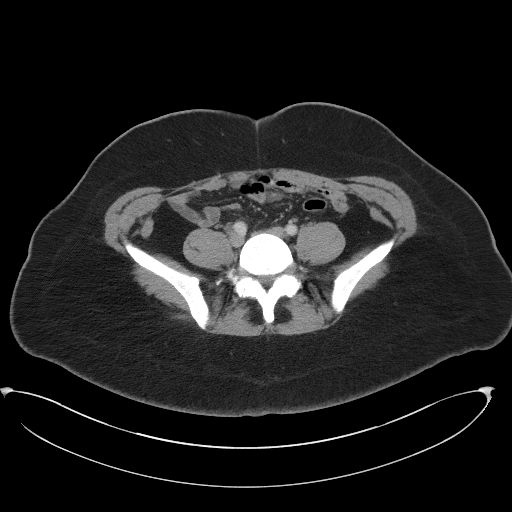
[im 56/107  soft-tissue]
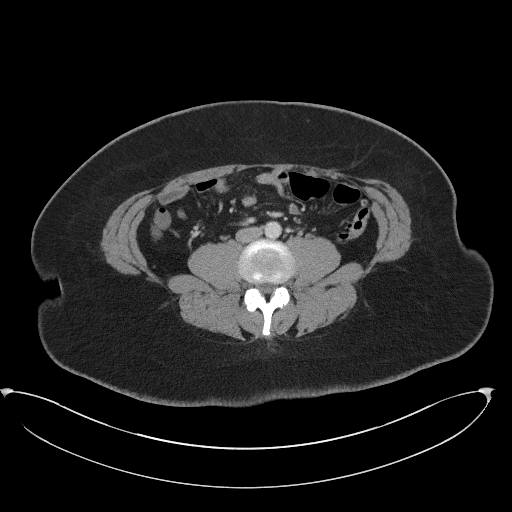
[im 62/107  soft-tissue]
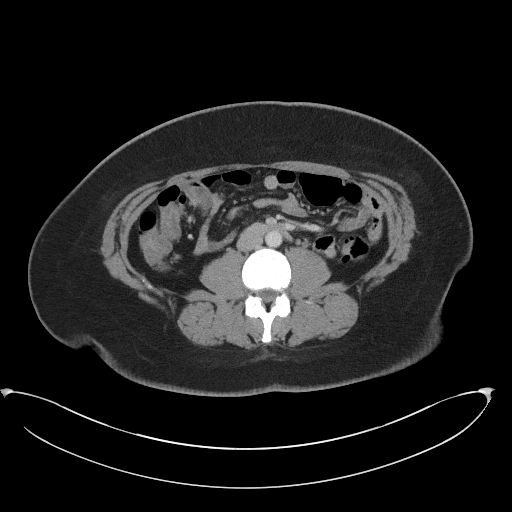
[im 67/107  soft-tissue]
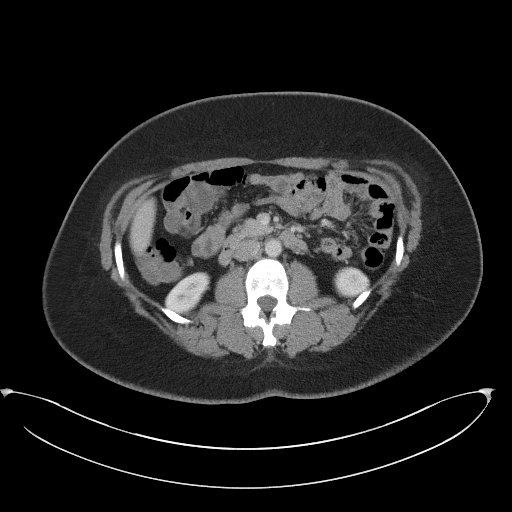
[im 67/107  bone]
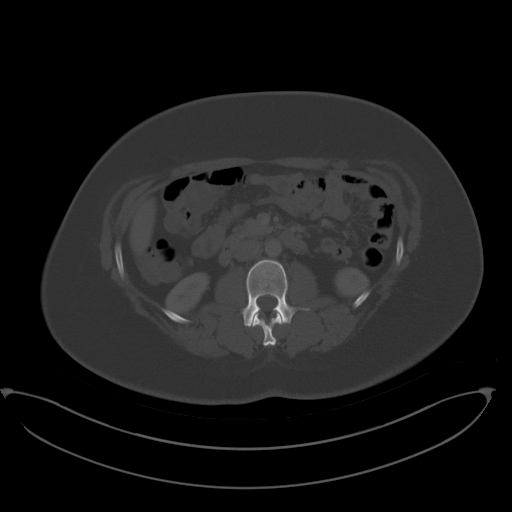
[im 79/107  soft-tissue]
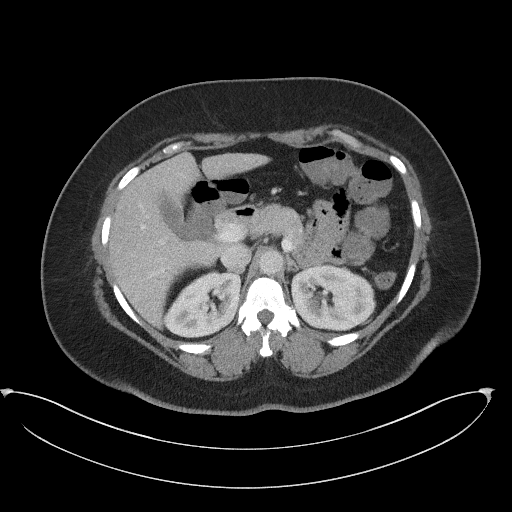
[im 84/107  soft-tissue]
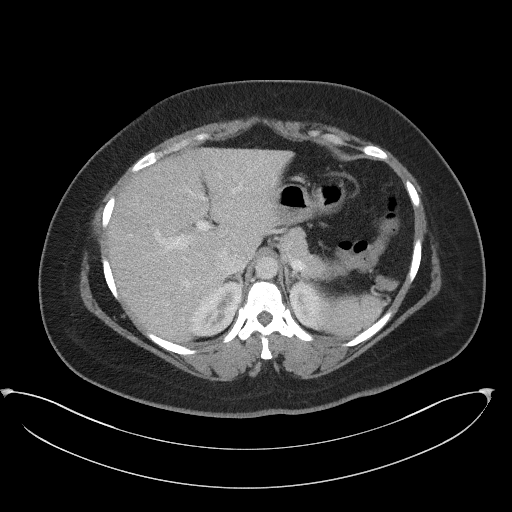
[im 90/107  soft-tissue]
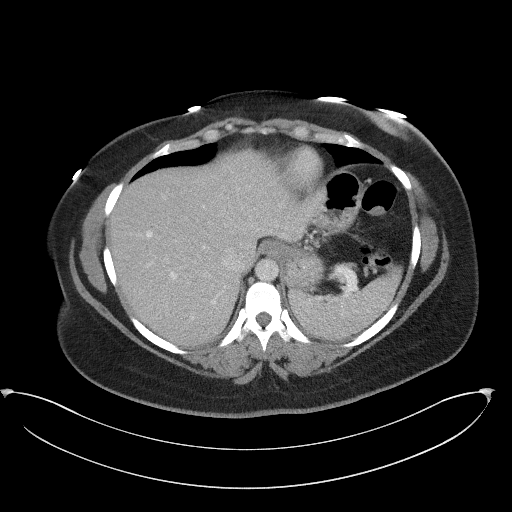
[im 101/107  soft-tissue]
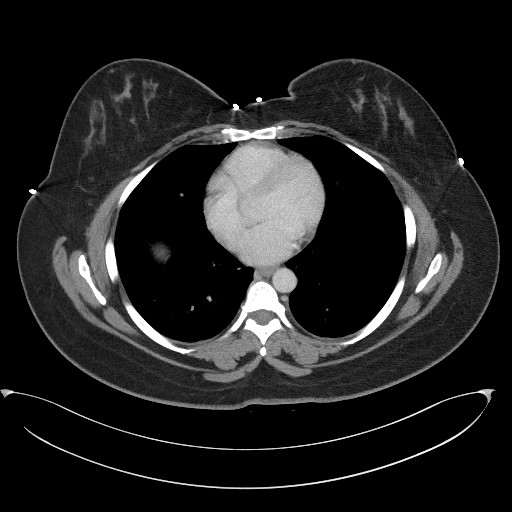

[Series 5: coronal st · coronal · 0.92mm/px · 3 of 101 slices shown]
[im 34/101  soft-tissue]
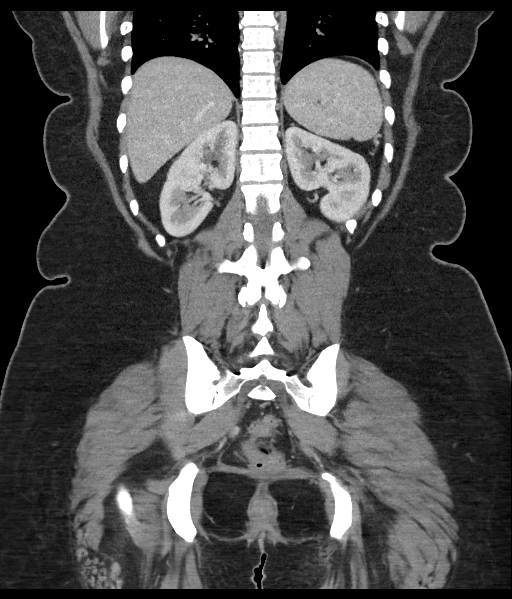
[im 45/101  soft-tissue]
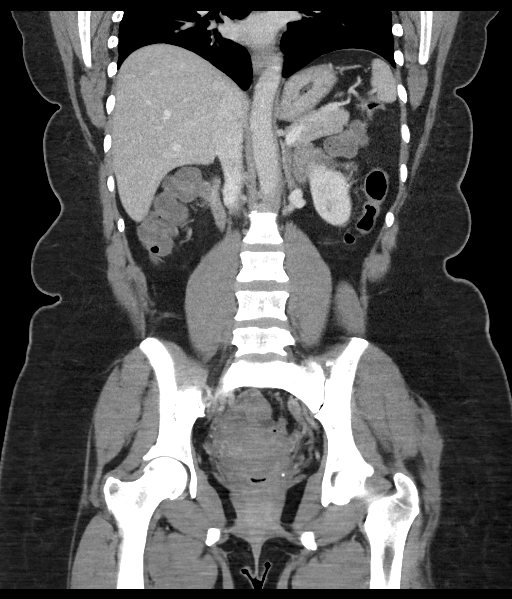
[im 56/101  soft-tissue]
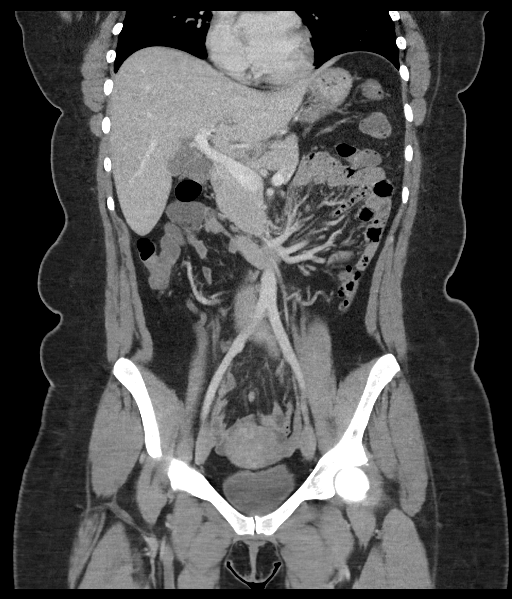

[16 of 46 positions shown; findings below may reference images not displayed]

FINDINGS: Lower chest: Incidental imaging of the lung bases without effusion.
No sign of consolidative changes.

Hepatobiliary: Hepatic steatosis. No focal, suspicious hepatic
lesion. Portal vein is patent. No pericholecystic stranding or
biliary duct distension.

Pancreas: Normal, without mass, inflammation or ductal dilatation.

Spleen: Spleen normal size and contour.

Adrenals/Urinary Tract: Adrenal glands are normal.

Symmetric renal enhancement. No hydronephrosis. Urinary bladder with
smooth contours and with under distension.

Stomach/Bowel: No acute gastrointestinal process. Small bowel
nondilated and without adjacent stranding. The appendix is normal.
Fluid-filled colon without distension. Signs of colonic
diverticulosis without evidence of diverticulitis.

Vascular/Lymphatic: Normal caliber abdominal aorta. Smooth contour
the IVC. There is no gastrohepatic or hepatoduodenal ligament
lymphadenopathy. No retroperitoneal or mesenteric lymphadenopathy.

No pelvic sidewall lymphadenopathy.

Reproductive: Small amount of free fluid in the pelvis, nonspecific
and likely physiologic. Unremarkable CT appearance of uterus and
adnexal structures

Other: Trace free fluid in the pelvis as described.

Musculoskeletal: No acute bone finding or destructive bone process.
Sclerosis of bilateral sacroiliac joints is similar to previous
imaging
IMPRESSION: 1. Signs of colonic diverticulosis without evidence of
diverticulitis.
2. Normal appendix.
3. Normal gallbladder.
4. Sacroiliac sclerosis bilaterally with similar appearance raising
the question of sacroiliitis.
5. Hepatic steatosis.

## 2021-09-23 ENCOUNTER — Other Ambulatory Visit: Payer: Self-pay

## 2021-09-23 ENCOUNTER — Encounter (HOSPITAL_BASED_OUTPATIENT_CLINIC_OR_DEPARTMENT_OTHER): Payer: Self-pay | Admitting: Emergency Medicine

## 2021-09-23 ENCOUNTER — Emergency Department (HOSPITAL_BASED_OUTPATIENT_CLINIC_OR_DEPARTMENT_OTHER)
Admission: EM | Admit: 2021-09-23 | Discharge: 2021-09-23 | Disposition: A | Discharge status: Home / Self Care | Payer: 59 | Attending: Emergency Medicine | Admitting: Emergency Medicine

## 2021-09-23 DIAGNOSIS — Z7982 Long term (current) use of aspirin: Secondary | ICD-10-CM | POA: Insufficient documentation

## 2021-09-23 DIAGNOSIS — I10 Essential (primary) hypertension: Secondary | ICD-10-CM | POA: Diagnosis present

## 2021-09-23 DIAGNOSIS — R200 Anesthesia of skin: Secondary | ICD-10-CM | POA: Insufficient documentation

## 2021-09-23 HISTORY — DX: Essential (primary) hypertension: I10

## 2021-09-23 LAB — BASIC METABOLIC PANEL
Anion gap: 6 (ref 5–15)
BUN: 11 mg/dL (ref 6–20)
CO2: 26 mmol/L (ref 22–32)
Calcium: 9 mg/dL (ref 8.9–10.3)
Chloride: 105 mmol/L (ref 98–111)
Creatinine, Ser: 0.79 mg/dL (ref 0.44–1.00)
GFR, Estimated: >60 mL/min (ref >60)
Glucose, Bld: 83 mg/dL (ref 70–99)
Potassium: 4.2 mmol/L (ref 3.5–5.1)
Sodium: 137 mmol/L (ref 135–145)

## 2021-09-23 LAB — CBC WITH DIFFERENTIAL/PLATELET
Abs Immature Granulocytes: 0.01 10*3/uL (ref 0.00–0.07)
Basophils Absolute: 0 10*3/uL (ref 0.0–0.1)
Basophils Relative: 0 %
Eosinophils Absolute: 0.1 10*3/uL (ref 0.0–0.5)
Eosinophils Relative: 2 %
HCT: 37 % (ref 36.0–46.0)
Hemoglobin: 12.2 g/dL (ref 12.0–15.0)
Immature Granulocytes: 0 %
Lymphocytes Relative: 42 %
Lymphs Abs: 2.7 10*3/uL (ref 0.7–4.0)
MCH: 28.7 pg (ref 26.0–34.0)
MCHC: 33 g/dL (ref 30.0–36.0)
MCV: 87.1 fL (ref 80.0–100.0)
Monocytes Absolute: 0.6 10*3/uL (ref 0.1–1.0)
Monocytes Relative: 9 %
Neutro Abs: 3 10*3/uL (ref 1.7–7.7)
Neutrophils Relative %: 47 %
Platelets: 201 10*3/uL (ref 150–400)
RBC: 4.25 MIL/uL (ref 3.87–5.11)
RDW: 13.7 % (ref 11.5–15.5)
WBC: 6.4 10*3/uL (ref 4.0–10.5)
nRBC: 0 % (ref 0.0–0.2)

## 2021-09-23 LAB — TROPONIN I (HIGH SENSITIVITY): Troponin I (High Sensitivity): <2 ng/L (ref <18)

## 2021-09-23 MED ORDER — CARVEDILOL 3.125 MG PO TABS: 3.1250 mg | ORAL_TABLET | Freq: Two times a day (BID) | ORAL | 0 refills | Status: DC

## 2021-09-23 MED ORDER — OLMESARTAN MEDOXOMIL-HCTZ 40-25 MG PO TABS: 1.0000 | ORAL_TABLET | Freq: Every day | ORAL | 0 refills | Status: DC

## 2021-09-23 NOTE — ED Provider Notes (Signed)
[facility] EMERGENCY DEPARTMENT Provider Note   CSN: [id number] Arrival date & time: 09/23/21  0115     History Chief Complaint  Patient presents with   Hypertension    [name] Guerra is a 40 y.o. female.  Patient is a 40 year old female with past medical history of hypertension presenting with complaints of elevated blood pressure.  She states for the past several days, she has felt fatigued, occasionally tight in the chest, occasional left arm numbness.  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  Her blood pressure was 150/100 and she was advised to come to the ER to be checked out.  She denies to me she is having any difficulty breathing.  She does report increased stress recently.  She also tells me she has been off of her antihypertensive medicines for several months since running out of them.  The history is provided by the patient.  Hypertension This is a recurrent problem. The problem occurs constantly. The problem has not changed since onset.Nothing aggravates the symptoms. Nothing relieves the symptoms.      Past Medical History:  Diagnosis Date   Depression    POST PARTUM   Diverticulitis    Dysrhythmia    Gestational diabetes    diet controlled   Hx of Bell's palsy    as newborn   Hypertension    Kidney stone    Lymphadenitis    LEFT LOWER LEG   SVD (spontaneous vaginal delivery) 05/24/2012    Patient Active Problem List   Diagnosis Date Noted   Sprain of interphalangeal joint of left ring finger 04/09/2021   Acute mastitis of right breast 10/10/2015   Pregnancy induced hypertension 09/29/2015   NSVD (normal spontaneous vaginal delivery) 09/29/2015   HTN (hypertension) 05/01/2014   Palpitations 05/01/2014   Murmur 05/01/2014   DOE (dyspnea on exertion) 05/01/2014   Chest pain 05/01/2014    Past Surgical History:  Procedure Laterality Date   BREAST CYST EXCISION     LAPAROSCOPIC TUBAL LIGATION Bilateral 01/15/2016    Procedure: LAPAROSCOPIC TUBAL LIGATION;  Surgeon: Huel Cote, MD;  Location: [facility];  Service: Gynecology;  Laterality: Bilateral;   TRANSTHORACIC ECHOCARDIOGRAM  02/09/2012   EF=55%, Patent foramen ovale is present, Doppler suggest left to right interatrial shunt   WISDOM TOOTH EXTRACTION       OB History     Gravida  8   Para  6   Term  6   Preterm      AB  2   Living  6      SAB  2   IAB      Ectopic      Multiple  0   Live Births  6           Family History  Problem Relation Age of Onset   Diabetes Mother    Heart disease Maternal Aunt    Diabetes Maternal Aunt    Diabetes Maternal Uncle    Cancer Maternal Uncle        bone   Diabetes Maternal Grandmother    Hypertension Maternal Grandfather    Heart disease Maternal Grandfather    Diabetes Maternal Grandfather    Anesthesia problems Neg Hx     Social History   Tobacco Use   Smoking status: Never   Smokeless tobacco: Never  Vaping Use   Vaping Use: Never used  Substance Use Topics   Alcohol use: Yes  Comment: occasional   Drug use: No    Home Medications Prior to Admission medications   Medication Sig Start Date End Date Taking? Authorizing Provider  aspirin-acetaminophen-caffeine (EXCEDRIN MIGRAINE) [phone] MG tablet Take by mouth every 6 (six) hours as needed for headache.    [provider]  carvedilol (COREG) 3.125 MG tablet Take 1 tablet (3.125 mg total) by mouth 2 (two) times daily. 11/22/19 02/20/20  Chrystie Nose, MD  HYDROcodone-acetaminophen (NORCO/VICODIN) 5-325 MG tablet Take 1 tablet by mouth every 6 (six) hours as needed. 12/25/20   Gwyneth Sprout, MD  olmesartan-hydrochlorothiazide (BENICAR HCT) 40-25 MG tablet Take 1 tablet by mouth daily. 09/04/19   Hilty, Lisette Abu, MD  SHAROBEL 0.35 MG tablet Take 1 tablet by mouth daily. 12/06/15   [provider]    Allergies    Iron and Lisinopril  Review of Systems   Review of Systems  All other  systems reviewed and are negative.  Physical Exam Updated Vital Signs BP (!) 135/92 (BP Location: Left Arm)   Pulse 91   Temp 98.1 F (36.7 C) (Oral)   Resp 16   Ht 5\' 8"  (1.727 m)   Wt 102.1 kg   SpO2 100%   BMI 34.21 kg/m   Physical Exam Vitals and nursing note reviewed.  Constitutional:      General: She is not in acute distress.    Appearance: She is well-developed. She is not diaphoretic.  HENT:     Head: Normocephalic and atraumatic.  Cardiovascular:     Rate and Rhythm: Normal rate and regular rhythm.     Heart sounds: No murmur heard.   No friction rub. No gallop.  Pulmonary:     Effort: Pulmonary effort is normal. No respiratory distress.     Breath sounds: Normal breath sounds. No wheezing.  Abdominal:     General: Bowel sounds are normal. There is no distension.     Palpations: Abdomen is soft.     Tenderness: There is no abdominal tenderness.  Musculoskeletal:        General: Normal range of motion.     Cervical back: Normal range of motion and neck supple.  Skin:    General: Skin is warm and dry.  Neurological:     General: No focal deficit present.     Mental Status: She is alert and oriented to person, place, and time.     Cranial Nerves: No cranial nerve deficit.     Motor: No weakness.     Coordination: Coordination normal.    ED Results / Procedures / Treatments   Labs (all labs ordered are listed, but only abnormal results are displayed) Labs Reviewed  BASIC METABOLIC PANEL  CBC WITH DIFFERENTIAL/PLATELET  TROPONIN I (HIGH SENSITIVITY)    EKG EKG Interpretation  Date/Time:  Thursday September 23 2021 01:45:34 EST Ventricular Rate:  81 PR Interval:  140 QRS Duration: 88 QT Interval:  375 QTC Calculation: 436 R Axis:   45 Text Interpretation: Sinus rhythm Normal ECG Confirmed by [id number] (Geoffery Lyons) on 09/23/2021 1:50:57 AM  Radiology No results found.  Procedures Procedures   Medications Ordered in ED Medications - No data to  display  ED Course  I have reviewed the triage vital signs and the nursing notes.  Pertinent labs & imaging results that were available during my care of the patient were reviewed by me and considered in my medical decision making (see chart for details).    MDM Rules/Calculators/A&P  Patient presenting  here with complaints of elevated blood pressure, the events of which are described in the HPI.  Patient appears clinically well with stable vital signs.  Initial blood pressure 135/92.  Laboratory studies show normal renal function, negative troponin, and are basically normal.  Patient's blood pressure remains mildly elevated, but nothing appears emergent.  She has been off her medications since running out several months ago.  I will refill these prescriptions for her and have her follow-up with primary doctor as needed.  Final Clinical Impression(s) / ED Diagnoses Final diagnoses:  None    Rx / DC Orders ED Discharge Orders     None        Geoffery Lyons, MD 09/23/21 [phone]

## 2021-09-23 NOTE — ED Triage Notes (Signed)
Pt states her blood pressure has been high today  pt states at work it was like 160/120  Pt c/o some slight blurred vision, chest tightness and left arm numbness  Pt has hx of hypertension

## 2021-09-23 NOTE — Discharge Instructions (Signed)
Resume taking Benicar and Coreg as previously prescribed.  Refills of these medications have been provided for you here in the ER.  Keep a record of your blood pressures at home and take this with you to your next doctor's appointment to discuss.  Return to the ER in the meantime if symptoms significantly worsen or change.

## 2021-10-28 ENCOUNTER — Ambulatory Visit (HOSPITAL_BASED_OUTPATIENT_CLINIC_OR_DEPARTMENT_OTHER): Payer: 59 | Admitting: Family

## 2021-10-31 NOTE — Progress Notes (Signed)
Office Visit    Patient Name: [name] Guerra Date of Encounter: 11/01/2021  PCP:  No primary care provider on file.   [facility]  Cardiologist:  Skeet Latch, MD  Advanced Practice Provider:  No care team member to display Electrophysiologist:  None      Chief Complaint    [name] Guerra is a 41 y.o. female with a hx of hypertension, palpitations, PVC, obesity presents today for overdue cardiology follow up   Past Medical History    Past Medical History:  Diagnosis Date   Depression    POST PARTUM   Diverticulitis    Dysrhythmia    Gestational diabetes    diet controlled   Hx of Bell's palsy    as newborn   Hypertension    Kidney stone    Lymphadenitis    LEFT LOWER LEG   SVD (spontaneous vaginal delivery) 05/24/2012   Past Surgical History:  Procedure Laterality Date   BREAST CYST EXCISION     LAPAROSCOPIC TUBAL LIGATION Bilateral 01/15/2016   Procedure: LAPAROSCOPIC TUBAL LIGATION;  Surgeon: Paula Compton, MD;  Location: [facility];  Service: Gynecology;  Laterality: Bilateral;   TRANSTHORACIC ECHOCARDIOGRAM  02/09/2012   EF=55%, Patent foramen ovale is present, Doppler suggest left to right interatrial shunt   WISDOM TOOTH EXTRACTION      Allergies  Allergies  Allergen Reactions   Iron Other (See Comments)    Other reaction(s): CONSTIPATION Other reaction(s): CONSTIPATION    Lisinopril Cough    History of Present Illness    [name] Guerra is a 41 y.o. female with a hx of hypertension, palpitations, PVC, obesity last seen in 2020.  Previously seen by Dr. Ellyn Hack in 2013. Referred during 31st week of pregnancy, her 5th pregnancy, for palpitations. Prior echo with mild concentric LVH and small PFO with small left to right shunt, left atria normal in size. Monitor showed sinus tachycardia but no arrhythmias.   She was seen by Dr. Debara Pickett due to chest pain and dyspnea. Exercise stress testing 2015 no evidence of ischemia.  Did note PVCs though beta blocker deferred as overall asymptomatic. Echo 2014 normal LVEF 60-65%, trivial pericardial effusion, no significant valvular abnormalities. She was lost to follow up until 08/2019 and noted symptomatic PVC as well as elevated blood pressure. Bystolic was initiated and sleep study ordered due to Epworth score of 11. She has since been lost to follow up. It does not appear sleep study was completed.   Seen in ED 09/23/21 for hypertension associated with fatigue, chest tightness. She had been off medications since running out several months prior and they were refilled by ED provider. 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   She presents today for follow up. She reports she is tired and she is concerned about her consistently elevated BP. She reports she has left arm tingling when BP is elevated and this is usually her indication that BP is up.  She checks it periodically at work and at home and diastolic BP has consistently been greater than 90.  On 09/23/21 in addition to elevated blood pressure at work, she also had blurred vision and lightheadedness prompting her to go to the ED for blood pressure management.  She has an erratic sleep schedule and works second or third shift in addition to raising 6 daughters (ranging in age from 86 to 21 years) and managing a part-time business. She denies chest pain,  shortness of breath, palpitations, melena, hematuria, hemoptysis, diaphoresis, weakness, presyncope, syncope, orthopnea, and PND.   EKGs/Labs/Other Studies Reviewed:   The following studies were reviewed today:  EKG:  EKG is not ordered today.    Recent Labs: 01/18/2021: ALT 14 09/23/2021: BUN 11; Creatinine, Ser 0.79; Hemoglobin 12.2; Platelets 201; Potassium 4.2; Sodium 137  Recent Lipid Panel No results found for: CHOL, TRIG, HDL, CHOLHDL, VLDL, LDLCALC, LDLDIRECT   Home Medications   Current Meds  Medication Sig    olmesartan-hydrochlorothiazide (BENICAR HCT) 40-25 MG tablet Take 1 tablet by mouth daily.   [DISCONTINUED] carvedilol (COREG) 3.125 MG tablet Take 1 tablet (3.125 mg total) by mouth 2 (two) times daily.   [DISCONTINUED] nebivolol (BYSTOLIC) 5 MG tablet Take 1 tablet (5 mg total) by mouth daily.     Review of Systems      +fatigue +chronic lymphedema LLE All other systems reviewed and are otherwise negative except as noted above.  Physical Exam    VS:  BP (!) 126/100    Pulse 89    Ht 5\' 9"  (1.753 m)    Wt 237 lb 9.6 oz (107.8 kg)    SpO2 98%    BMI 35.09 kg/m  , BMI Body mass index is 35.09 kg/m.  Wt Readings from Last 3 Encounters:  11/01/21 237 lb 9.6 oz (107.8 kg)  09/23/21 225 lb (102.1 kg)  05/19/21 229 lb (103.9 kg)     GEN: Well nourished, well developed, in no acute distress. HEENT: normal. Neck: Supple, no JVD, carotid bruits, or masses. Cardiac: RRR, no murmurs, rubs, or gallops. No clubbing, cyanosis. Has chronic lymphedema LLE, no swelling RLE. Radials/PT 2+ and equal bilaterally.  Respiratory:  Respirations regular and unlabored, clear to auscultation bilaterally. GI: Soft, nontender, nondistended. MS: No deformity or atrophy. Skin: Warm and dry, no rash. Neuro:  Strength and sensation are intact. Psych: Normal affect.  Assessment & Plan    HTN - DBP has been consistently greater than 90 mmHg.  She has not been taking carvedilol on a twice daily basis due to forgetting second dose.  She would prefer to take a once daily medication. Documentation reveals that she previously took Bystolic, however she does not recall this medication specifically. She is agreeable to the price we were able to find at [facility] pharmacy.  Will discontinue carvedilol and start Bystolic 5 mg once daily.  Encouraged her to monitor blood pressure at work on a consistent basis and report to [place] if diastolic blood pressure remains greater than 90 mmHg.  We will plan for follow-up in 2 months. We  discussed the additional benefit of physical activity and low sodium diet. Encouraged her to increase physical exercise to 150 minutes of moderate exercise per week and limit sodium to 2000 mg daily. Information was given to her in her AVS.  I also explained the impact of OSA on elevated blood pressure and as noted below encouraged her to pursue sleep study. Continue Benicar.  Palpitations / PVC - she reports rare occasions of palpitations today.  Reports noncompliance with twice daily dosing of carvedilol.  We will change her to once daily dosing of nebivolol. No indication for further evaluation at this time.   Snores/Daytime somnolence - Sleep study previously ordered but not completed. She admits to daytime somnolence and is aware that she snores.  Has not been told she has apneic episodes but sleeps a very erratic schedule.  We discussed home sleep study. She does not wish to pursue  at this time. Encouraged her to call back to pursue and would favor additional discussion at next ov due to impact this may have on HTN as noted above.   Obesity - Encouraged increased physical activity of 150 minutes moderate exercise per week, low sodium diet and weight loss.   Disposition: Follow up in 2 month(s) with Skeet Latch, MD or APP.  Signed, Emmaline Life, NP 11/01/2021, 11:02 AM [place]

## 2021-11-01 ENCOUNTER — Encounter (HOSPITAL_BASED_OUTPATIENT_CLINIC_OR_DEPARTMENT_OTHER): Payer: Self-pay | Admitting: Family

## 2021-11-01 ENCOUNTER — Other Ambulatory Visit: Payer: Self-pay

## 2021-11-01 ENCOUNTER — Ambulatory Visit (HOSPITAL_BASED_OUTPATIENT_CLINIC_OR_DEPARTMENT_OTHER): Payer: 59 | Admitting: Nurse Practitioner

## 2021-11-01 VITALS — BP 126/100 | HR 89 | Ht 69.0 in | Wt 237.6 lb

## 2021-11-01 DIAGNOSIS — E669 Obesity, unspecified: Secondary | ICD-10-CM

## 2021-11-01 DIAGNOSIS — R400 Somnolence: Secondary | ICD-10-CM

## 2021-11-01 DIAGNOSIS — I10 Essential (primary) hypertension: Secondary | ICD-10-CM

## 2021-11-01 DIAGNOSIS — R002 Palpitations: Secondary | ICD-10-CM

## 2021-11-01 DIAGNOSIS — R0683 Snoring: Secondary | ICD-10-CM

## 2021-11-01 MED ORDER — NEBIVOLOL HCL 5 MG PO TABS: 5.0000 mg | ORAL_TABLET | Freq: Every day | ORAL | 3 refills | Status: DC

## 2021-11-01 NOTE — Patient Instructions (Addendum)
Medication Instructions:  Your physician has recommended you make the following change in your medication:   STOP Carvedilol  START Nebivolol (Bystolic) 5 mg once daily   *If you need a refill on your cardiac medications before your next appointment, please call your pharmacy*   Lab Work: None Ordered  Testing/Procedures: None Ordered   Follow-Up: At [facility], you and your health needs are our priority.  As part of our continuing mission to provide you with exceptional heart care, we have created designated Provider Care Teams.  These Care Teams include your primary Cardiologist (physician) and Advanced Practice Providers (APPs -  Physician Assistants and Nurse Practitioners) who all work together to provide you with the care you need, when you need it.  We recommend signing up for the patient portal called "MyChart".  Sign up information is provided on this After Visit Summary.  MyChart is used to connect with patients for Virtual Visits (Telemedicine).  Patients are able to view lab/test results, encounter notes, upcoming appointments, etc.  Non-urgent messages can be sent to your provider as well.   To learn more about what you can do with MyChart, go to [email or web address].    Your next appointment:   2 month(s)  The format for your next appointment:   In Person  Provider:   Chilton Siiffany Pecos, MD or APP Edd FabianJesse Cleaver, NP or Gillian Shieldsaitlin Walker, NP If MD is not listed, click here to update    :1}    Other Instructions DASH Eating Plan DASH stands for Dietary Approaches to Stop Hypertension. The DASH eating plan is a healthy eating plan that has been shown to: Reduce high blood pressure (hypertension). Reduce your risk for type 2 diabetes, heart disease, and stroke. Help with weight loss. What are tips for following this plan? Reading food labels Check food labels for the amount of salt (sodium) per serving. Choose foods with less than 5 percent of the Daily Value of  sodium. Generally, foods with less than 300 milligrams (mg) of sodium per serving fit into this eating plan. To find whole grains, look for the word "whole" as the first word in the ingredient list. Shopping Buy products labeled as "low-sodium" or "no salt added." Buy fresh foods. Avoid canned foods and pre-made or frozen meals. Cooking Avoid adding salt when cooking. Use salt-free seasonings or herbs instead of table salt or sea salt. Check with your health care provider or pharmacist before using salt substitutes. Do not fry foods. Cook foods using healthy methods such as baking, boiling, grilling, roasting, and broiling instead. Cook with heart-healthy oils, such as olive, canola, avocado, soybean, or sunflower oil. Meal planning  Eat a balanced diet that includes: 4 or more servings of fruits and 4 or more servings of vegetables each day. Try to fill one-half of your plate with fruits and vegetables. 6-8 servings of whole grains each day. Less than 6 oz (170 g) of lean meat, poultry, or fish each day. A 3-oz (85-g) serving of meat is about the same size as a deck of cards. One egg equals 1 oz (28 g). 2-3 servings of low-fat dairy each day. One serving is 1 cup (237 mL). 1 serving of nuts, seeds, or beans 5 times each week. 2-3 servings of heart-healthy fats. Healthy fats called omega-3 fatty acids are found in foods such as walnuts, flaxseeds, fortified milks, and eggs. These fats are also found in cold-water fish, such as sardines, salmon, and mackerel. Limit how much you eat of: Canned  or prepackaged foods. Food that is high in trans fat, such as some fried foods. Food that is high in saturated fat, such as fatty meat. Desserts and other sweets, sugary drinks, and other foods with added sugar. Full-fat dairy products. Do not salt foods before eating. Do not eat more than 4 egg yolks a week. Try to eat at least 2 vegetarian meals a week. Eat more home-cooked food and less restaurant,  buffet, and fast food. Lifestyle When eating at a restaurant, ask that your food be prepared with less salt or no salt, if possible. If you drink alcohol: Limit how much you use to: 0-1 drink a day for women who are not pregnant. 0-2 drinks a day for men. Be aware of how much alcohol is in your drink. In the [place] one drink equals one 12 oz bottle of beer (355 mL), one 5 oz glass of wine (148 mL), or one 1 oz glass of hard liquor (44 mL). General information Avoid eating more than 2,300 mg of salt a day. If you have hypertension, you may need to reduce your sodium intake to 1,500 mg a day. Work with your health care provider to maintain a healthy body weight or to lose weight. Ask what an ideal weight is for you. Get at least 30 minutes of exercise that causes your heart to beat faster (aerobic exercise) most days of the week. Activities may include walking, swimming, or biking. Work with your health care provider or dietitian to adjust your eating plan to your individual calorie needs. What foods should I eat? Fruits All fresh, dried, or frozen fruit. Canned fruit in natural juice (without added sugar). Vegetables Fresh or frozen vegetables (raw, steamed, roasted, or grilled). Low-sodium or reduced-sodium tomato and vegetable juice. Low-sodium or reduced-sodium tomato sauce and tomato paste. Low-sodium or reduced-sodium canned vegetables. Grains Whole-grain or whole-wheat bread. Whole-grain or whole-wheat pasta. Brown rice. Orpah Cobb. Bulgur. Whole-grain and low-sodium cereals. Pita bread. Low-fat, low-sodium crackers. Whole-wheat flour tortillas. Meats and other proteins Skinless chicken or [place]. Ground chicken or [place]. Pork with fat trimmed off. Fish and seafood. Egg whites. Dried beans, peas, or lentils. Unsalted nuts, nut butters, and seeds. Unsalted canned beans. Lean cuts of beef with fat trimmed off. Low-sodium, lean precooked or cured meat, such as sausages or meat  loaves. Dairy Low-fat (1%) or fat-free (skim) milk. Reduced-fat, low-fat, or fat-free cheeses. Nonfat, low-sodium ricotta or cottage cheese. Low-fat or nonfat yogurt. Low-fat, low-sodium cheese. Fats and oils Soft margarine without trans fats. Vegetable oil. Reduced-fat, low-fat, or light mayonnaise and salad dressings (reduced-sodium). Canola, safflower, olive, avocado, soybean, and sunflower oils. Avocado. Seasonings and condiments Herbs. Spices. Seasoning mixes without salt. Other foods Unsalted popcorn and pretzels. Fat-free sweets. The items listed above may not be a complete list of foods and beverages you can eat. Contact a dietitian for more information. What foods should I avoid? Fruits Canned fruit in a light or heavy syrup. Fried fruit. Fruit in cream or butter sauce. Vegetables Creamed or fried vegetables. Vegetables in a cheese sauce. Regular canned vegetables (not low-sodium or reduced-sodium). Regular canned tomato sauce and paste (not low-sodium or reduced-sodium). Regular tomato and vegetable juice (not low-sodium or reduced-sodium). Rosita Fire. Olives. Grains Baked goods made with fat, such as croissants, muffins, or some breads. Dry pasta or rice meal packs. Meats and other proteins Fatty cuts of meat. Ribs. Fried meat. Tomasa Blase. Bologna, salami, and other precooked or cured meats, such as sausages or meat loaves. Fat from the  back of a pig (fatback). Bratwurst. Salted nuts and seeds. Canned beans with added salt. Canned or smoked fish. Whole eggs or egg yolks. Chicken or [place] with skin. Dairy Whole or 2% milk, cream, and half-and-half. Whole or full-fat cream cheese. Whole-fat or sweetened yogurt. Full-fat cheese. Nondairy creamers. Whipped toppings. Processed cheese and cheese spreads. Fats and oils Butter. Stick margarine. Lard. Shortening. Ghee. Bacon fat. Tropical oils, such as coconut, palm kernel, or palm oil. Seasonings and condiments Onion salt, garlic salt, seasoned  salt, table salt, and sea salt. Worcestershire sauce. Tartar sauce. Barbecue sauce. Teriyaki sauce. Soy sauce, including reduced-sodium. Steak sauce. Canned and packaged gravies. Fish sauce. Oyster sauce. Cocktail sauce. Store-bought horseradish. Ketchup. Mustard. Meat flavorings and tenderizers. Bouillon cubes. Hot sauces. Pre-made or packaged marinades. Pre-made or packaged taco seasonings. Relishes. Regular salad dressings. Other foods Salted popcorn and pretzels. The items listed above may not be a complete list of foods and beverages you should avoid. Contact a dietitian for more information. Where to find more information National Heart, Lung, and Blood Institute: PopSteam.is American Heart Association: www.heart.org Academy of Nutrition and Dietetics: www.eatright.org National Kidney Foundation: www.kidney.org Summary The DASH eating plan is a healthy eating plan that has been shown to reduce high blood pressure (hypertension). It may also reduce your risk for type 2 diabetes, heart disease, and stroke. When on the DASH eating plan, aim to eat more fresh fruits and vegetables, whole grains, lean proteins, low-fat dairy, and heart-healthy fats. With the DASH eating plan, you should limit salt (sodium) intake to 2,300 mg a day. If you have hypertension, you may need to reduce your sodium intake to 1,500 mg a day. Work with your health care provider or dietitian to adjust your eating plan to your individual calorie needs. This information is not intended to replace advice given to you by your health care provider. Make sure you discuss any questions you have with your health care provider. Document Revised: 09/13/2019 Document Reviewed: 09/13/2019 Elsevier Patient Education  2022 Elsevier Inc.  Exercise recommendations: Goal of exercising for at least 30 minutes a day, at least 5 times per week.  Please exercise to a moderate exertion.  This means that while exercising it is difficult  to speak in full sentences, however you are not so short of breath that you feel you must stop, and not so comfortable that you can carry on a full conversation.  Exertion level should be approximately a 5/10, if 10 is the most exertion you can perform.  Diet recommendations: Recommend a heart healthy diet such as the Mediterranean diet.  This diet consists of plant based foods, healthy fats, lean meats, olive oil.  It suggests limiting the intake of simple carbohydrates such as white breads, pastries, and pastas.  It also limits the amount of red meat, wine, and dairy products such as cheese that one should consume on a daily basis.

## 2021-12-31 NOTE — Progress Notes (Signed)
? ? ?Office Visit  ?  ?Patient Name: [name] Guerra ?Date of Encounter: 01/03/2022 ? ?Primary Care Provider: ?Primary Cardiologist:  Chilton Si, MD ? ?Chief Complaint  ?  ?68-month follow-up for HTN ? ?History of Present Illness  ?  ?[name] Guerra is a 41 y.o. female with PMH of HTN, PVC's, and obesity.  She works as a Lawyer.  Initially seen in 2013 by Dr. Herbie Baltimore during 31st week of her fifth pregnancy.  Due sinus tachycardia and  mild concentric LVH and small PFO via echo.  No additional work-up was recommended.  Echo performed 2014 with LVEF 60-65%, She was then seen in 2015 by Dr. Rennis Golden with complaints of chest pain and dyspnea.  Pleated exercise testing revealed no evidence of ischemia but did have PVCs.  In November 2020 she was seen again by Dr. Rennis Golden with complaints of fatigue, HTN, palpitations.  Hypertension was progressive with multiple adjustments to medication.  She was started on Coreg 3.125 mg daily and sent for sleep study however she failed to complete. ? ?In 10/04/21 she presented to [facility] with complaints of elevated blood pressures.  Also endorsed symptoms of fatigue and chest tightness, with left arm numbness.  She reported noncompliance due to running out of medications for several months.  EKG was normal and troponins were negative.  She was provided refills for blood pressure medication and directed to follow-up with PCP.  ? ?Since last being seen in our clinic Ms. Rayo reports doing well and reports last PVCs.  She has however experienced a few dizzy spells when standing from a sitting position.  She was started on Bystolic for PVCs and Benicar for blood pressure.  Her blood pressure today is well controlled at 112/80 and heart rate of 66. 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 She is unable to find time for exercise workout Today she denies chest pain, dyspnea, PND, orthopnea, nausea, vomiting, dizziness, syncope, edema,  weight gain, or early satiety. ? ?Past Medical History  ?  ?Past Medical History:  ?Diagnosis Date  ? Depression   ? POST PARTUM  ? Diverticulitis   ? Dysrhythmia   ? Gestational diabetes   ? diet controlled  ? Hx of Bell's palsy   ? as newborn  ? Hypertension   ? Kidney stone   ? Lymphadenitis   ? LEFT LOWER LEG  ? SVD (spontaneous vaginal delivery) 05/24/2012  ? ?Past Surgical History:  ?Procedure Laterality Date  ? BREAST CYST EXCISION    ? LAPAROSCOPIC TUBAL LIGATION Bilateral 01/15/2016  ? Procedure: LAPAROSCOPIC TUBAL LIGATION;  Surgeon: Huel Cote, MD;  Location: [facility];  Service: Gynecology;  Laterality: Bilateral;  ? TRANSTHORACIC ECHOCARDIOGRAM  02/09/2012  ? EF=55%, Patent foramen ovale is present, Doppler suggest left to right interatrial shunt  ? WISDOM TOOTH EXTRACTION    ? ? ?Allergies ? ?Allergies  ?Allergen Reactions  ? Iron Other (See Comments)  ?  Other reaction(s): CONSTIPATION ?Other reaction(s): CONSTIPATION ?  ? Lisinopril Cough  ? ? ?Home Medications  ?  ?Current Outpatient Medications  ?Medication Sig Dispense Refill  ? nebivolol (BYSTOLIC) 5 MG tablet Take 1 tablet (5 mg total) by mouth daily. 90 tablet 3  ? olmesartan-hydrochlorothiazide (BENICAR HCT) 40-25 MG tablet Take 1 tablet by mouth daily. 90 tablet 3  ? ?No current facility-administered medications for this visit.  ?  ? ?Review of Systems  ?Please see the history of present  illness.    ?(+) Chronic lymphedema in left leg ?(+) Dizziness with standing occasionally and fatigue ? ?All other systems reviewed and are otherwise negative except as noted above. ? ?Physical Exam  ?  ?Wt Readings from Last 3 Encounters:  ?01/03/22 230 lb 14.4 oz (104.7 kg)  ?11/01/21 237 lb 9.6 oz (107.8 kg)  ?09/23/21 225 lb (102.1 kg)  ? ? ?VS: ?Vitals:  ? 01/03/22 1028  ?BP: 112/80  ?Pulse: 66  ?SpO2: 93%  ?  ? ?GEN: Well nourished, well developed, in no acute distress. ?Neck: Supple, no JVD,carotid bruits, or masses. ?Cardiac: S1,S2,RRR, no murmurs,  rubs, or gallops. No clubbing, cyanosis, edema.  ?Radials/PT 2+and equal bilaterally.  ?Respiratory: Respirations regular and unlabored, clear to auscultation bilaterally. ?MS: no deformity or atrophy. ?Skin: warm and dry, no rash. ?Neuro:  Strength and sensation are intact. ?Psych: Normal affect. ? ?Accessory Clinical Findings  ?  ?ECG personally reviewed by me today -not ordered today ? ?Risk Assessment/Calculations:   ?  ?Lab Results  ?Component Value Date  ? WBC 6.4 09/23/2021  ? HGB 12.2 09/23/2021  ? HCT 37.0 09/23/2021  ? MCV 87.1 09/23/2021  ? PLT 201 09/23/2021  ? ?Lab Results  ?Component Value Date  ? CREATININE 0.79 09/23/2021  ? BUN 11 09/23/2021  ? NA 137 09/23/2021  ? K 4.2 09/23/2021  ? CL 105 09/23/2021  ? CO2 26 09/23/2021  ? ?Lab Results  ?Component Value Date  ? ALT 14 01/18/2021  ? AST 12 (L) 01/18/2021  ? ALKPHOS 67 01/18/2021  ? BILITOT 0.4 01/18/2021  ? ?No results found for: CHOL, HDL, LDLCALC, LDLDIRECT, TRIG, CHOLHDL  ?No results found for: HGBA1C ? ?Assessment & Plan  ?  ?1.  Essential hypertension: ?-Continue Bystolic 5 mg and Benicar 40-25 mg.  She reports compliance with her Bystolic and has not missed any doses.  She has however experienced dizziness with standing and we discussed rising slowly before walking from a sitting position. ?-We discussed at length the importance of restricting sodium, physical activity, and weight loss to controlling hypertension.  She was encouraged to exercise at least 15 minutes of cardiovascular per day for 5 days a week. ?-Education was also provided about target organ damage due to uncontrolled hypertension ? ?2.  Palpitations: ?-Patient reports since beginning Bystolic that palpitations have decreased. ?-We discussed the importance of abstaining from excess triggers such as caffeine, energy drinks, and chocolate ? ?3.  Medication compliance: ?-Education was also provided about target organ damage due to uncontrolled hypertension ?-Patient has no  current barriers to obtaining medication. ? ?4.  Daytime somnolence: ?-Patient states that at this time she is still not interested in sleep study.  I encouraged her to research and discuss with family and friends that have CPAP. ?-Information provided on AVS regarding sleep apnea and hypertension. ? ?Disposition: Follow-up with Dr.[place] or APP in 6 months or sooner if needed. ?   ?Medication Adjustments/Labs and Tests Ordered: ?Current medicines are reviewed at length with the patient today.  Concerns regarding medicines are outlined above.  ?Tests Ordered: ?No orders of the defined types were placed in this encounter. ? ?Medication Changes: ?Meds ordered this encounter  ?Medications  ? olmesartan-hydrochlorothiazide (BENICAR HCT) 40-25 MG tablet  ?  Sig: Take 1 tablet by mouth daily.  ?  Dispense:  90 tablet  ?  Refill:  3  ? ? ? ?Napoleon Form, Leodis Rains, NP ?01/03/2022, 12:00 PM ?    ?

## 2022-01-03 ENCOUNTER — Ambulatory Visit (HOSPITAL_BASED_OUTPATIENT_CLINIC_OR_DEPARTMENT_OTHER): Payer: 59 | Admitting: Nurse Practitioner

## 2022-01-03 ENCOUNTER — Other Ambulatory Visit: Payer: Self-pay

## 2022-01-03 VITALS — BP 112/80 | HR 66 | Ht 69.0 in | Wt 230.9 lb

## 2022-01-03 DIAGNOSIS — I10 Essential (primary) hypertension: Secondary | ICD-10-CM | POA: Diagnosis not present

## 2022-01-03 DIAGNOSIS — Z9114 Patient's other noncompliance with medication regimen: Secondary | ICD-10-CM

## 2022-01-03 DIAGNOSIS — R002 Palpitations: Secondary | ICD-10-CM | POA: Diagnosis not present

## 2022-01-03 DIAGNOSIS — R400 Somnolence: Secondary | ICD-10-CM

## 2022-01-03 DIAGNOSIS — Z91148 Patient's other noncompliance with medication regimen for other reason: Secondary | ICD-10-CM

## 2022-01-03 MED ORDER — OLMESARTAN MEDOXOMIL-HCTZ 40-25 MG PO TABS: 1.0000 | ORAL_TABLET | Freq: Every day | ORAL | 3 refills | Status: DC

## 2022-01-03 NOTE — Patient Instructions (Signed)
Medication Instructions:  Your Physician recommend you continue on your current medication as directed.    *If you need a refill on your cardiac medications before your next appointment, please call your pharmacy*  Follow-Up: At [facility], you and your health needs are our priority.  As part of our continuing mission to provide you with exceptional heart care, we have created designated Provider Care Teams.  These Care Teams include your primary Cardiologist (physician) and Advanced Practice Providers (APPs -  Physician Assistants and Nurse Practitioners) who all work together to provide you with the care you need, when you need it.  We recommend signing up for the patient portal called "MyChart".  Sign up information is provided on this After Visit Summary.  MyChart is used to connect with patients for Virtual Visits (Telemedicine).  Patients are able to view lab/test results, encounter notes, upcoming appointments, etc.  Non-urgent messages can be sent to your provider as well.   To learn more about what you can do with MyChart, go to [email or web address].    Your next appointment:     Other Instructions  Orthostatic Hypotension Blood pressure is a measurement of how strongly, or weakly, your circulating blood is pressing against the walls of your arteries. Orthostatic hypotension is a drop in blood pressure that can happen when you change positions, such as when you go from lying down to standing. Arteries are blood vessels that carry blood from your heart throughout your body. When blood pressure is too low, you may not get enough blood to your brain or to the rest of your organs. Orthostatic hypotension can cause light-headedness, sweating, rapid heartbeat, blurred vision, and fainting. These symptoms require further investigation into the cause. What are the causes? Orthostatic hypotension can be caused by many things, including: Sudden changes in posture, such as standing up  quickly after you have been sitting or lying down. Loss of blood (anemia) or loss of body fluids (dehydration). Heart problems, neurologic problems, or hormone problems. Pregnancy. Aging. The risk for this condition increases as you get older. Severe infection (sepsis). Certain medicines, such as medicines for high blood pressure or medicines that make the body lose excess fluids (diuretics). What are the signs or symptoms? Symptoms of this condition may include: Weakness, light-headedness, or dizziness. Sweating. Blurred vision. Tiredness (fatigue). Rapid heartbeat. Fainting, in severe cases. How is this diagnosed? This condition is diagnosed based on: Your symptoms and medical history. Your blood pressure measurements. Your health care provider will check your blood pressure when you are: Lying down. Sitting. Standing. A blood pressure reading is recorded as two numbers, such as "120 over 80" (or 120/80). The first ("top") number is called the systolic pressure. It is a measure of the pressure in your arteries as your heart beats. The second ("bottom") number is called the diastolic pressure. It is a measure of the pressure in your arteries when your heart relaxes between beats. Blood pressure is measured in a unit called mmHg. Healthy blood pressure for most adults is 120/80 mmHg. Orthostatic hypotension is defined as a 20 mmHg drop in systolic pressure or a 10 mmHg drop in diastolic pressure within 3 minutes of standing. Other information or tests that may be used to diagnose orthostatic hypotension include: Your other vital signs, such as your heart rate and temperature. Blood tests. An electrocardiogram (ECG) or echocardiogram. A Holter monitor. This is a device you wear that records your heart rhythm continuously, usually for 24-48 hours. Tilt table test. For this  test, you will be safely secured to a table that moves you from a lying position to an upright position. Your heart  rhythm and blood pressure will be monitored during the test. How is this treated? This condition may be treated by: Changing your diet. This may involve eating more salt (sodium) or drinking more water. Changing the dosage of certain medicines you are taking that might be lowering your blood pressure. Correcting the underlying reason for the orthostatic hypotension. Wearing compression stockings. Taking medicines to raise your blood pressure. Avoiding actions that trigger symptoms. Follow these instructions at home: Medicines Take over-the-counter and prescription medicines only as told by your health care provider. Follow instructions from your health care provider about changing the dosage of your current medicines, if this applies. Do not stop or adjust any of your medicines on your own. Eating and drinking  Drink enough fluid to keep your urine pale yellow. Eat extra salt only as directed. Do not add extra salt to your diet unless advised by your health care provider. Eat frequent, small meals. Avoid standing up suddenly after eating. General instructions  Get up slowly from lying down or sitting positions. This gives your blood pressure a chance to adjust. Avoid hot showers and excessive heat as directed by your health care provider. Engage in regular physical activity as directed by your health care provider. If you have compression stockings, wear them as told. Keep all follow-up visits. This is important. Contact a health care provider if: You have a fever for more than 2-3 days. You feel more thirsty than usual. You feel dizzy or weak. Get help right away if: You have chest pain. You have a fast or irregular heartbeat. You become sweaty or feel light-headed. You feel short of breath. You faint. You have any symptoms of a stroke. "BE FAST" is an easy way to remember the main warning signs of a stroke: B - Balance. Signs are dizziness, sudden trouble walking, or loss of  balance. E - Eyes. Signs are trouble seeing or a sudden change in vision. F - Face. Signs are sudden weakness or numbness of the face, or the face or eyelid drooping on one side. A - Arms. Signs are weakness or numbness in an arm. This happens suddenly and usually on one side of the body. S - Speech. Signs are sudden trouble speaking, slurred speech, or trouble understanding what people say. T - Time. Time to call emergency services. Write down what time symptoms started. You have other signs of a stroke, such as: A sudden, severe headache with no known cause. Nausea or vomiting. Seizure. These symptoms may represent a serious problem that is an emergency. Do not wait to see if the symptoms will go away. Get medical help right away. Call your local emergency services (911 in the [place] Do not drive yourself to the hospital. Summary Orthostatic hypotension is a sudden drop in blood pressure. It can cause light-headedness, sweating, rapid heartbeat, blurred vision, and fainting. Orthostatic hypotension can be diagnosed by having your blood pressure taken while lying down, sitting, and then standing. Treatment may involve changing your diet, wearing compression stockings, sitting up slowly, adjusting your medicines, or correcting the underlying reason for the orthostatic hypotension. Get help right away if you have chest pain, a fast or irregular heartbeat, or symptoms of a stroke. This information is not intended to replace advice given to you by your health care provider. Make sure you discuss any questions you have with your  health care provider. Document Revised: 12/24/2020 Document Reviewed: 12/24/2020 Elsevier Patient Education  [place].  Exercise recommendations: The American Heart Association recommends 150 minutes of moderate intensity exercise weekly. Try 30 minutes of moderate intensity exercise 4-5 times per week. This could include walking, jogging, or  swimming.  Mediterranean Diet A Mediterranean diet refers to food and lifestyle choices that are based on the traditions of countries located on the [employer]. It focuses on eating more fruits, vegetables, whole grains, beans, nuts, seeds, and heart-healthy fats, and eating less dairy, meat, eggs, and processed foods with added sugar, salt, and fat. This way of eating has been shown to help prevent certain conditions and improve outcomes for people who have chronic diseases, like kidney disease and heart disease. What are tips for following this plan? Reading food labels Check the serving size of packaged foods. For foods such as rice and pasta, the serving size refers to the amount of cooked product, not dry. Check the total fat in packaged foods. Avoid foods that have saturated fat or trans fats. Check the ingredient list for added sugars, such as corn syrup. Shopping  Buy a variety of foods that offer a balanced diet, including: Fresh fruits and vegetables (produce). Grains, beans, nuts, and seeds. Some of these may be available in unpackaged forms or large amounts (in bulk). Fresh seafood. Poultry and eggs. Low-fat dairy products. Buy whole ingredients instead of prepackaged foods. Buy fresh fruits and vegetables in-season from local farmers markets. Buy plain frozen fruits and vegetables. If you do not have access to quality fresh seafood, buy precooked frozen shrimp or canned fish, such as tuna, salmon, or sardines. Stock your pantry so you always have certain foods on hand, such as olive oil, canned tuna, canned tomatoes, rice, pasta, and beans. Cooking Cook foods with extra-virgin olive oil instead of using butter or other vegetable oils. Have meat as a side dish, and have vegetables or grains as your main dish. This means having meat in small portions or adding small amounts of meat to foods like pasta or stew. Use beans or vegetables instead of meat in common dishes like  chili or lasagna. Experiment with different cooking methods. Try roasting, broiling, steaming, and sauting vegetables. Add frozen vegetables to soups, stews, pasta, or rice. Add nuts or seeds for added healthy fats and plant protein at each meal. You can add these to yogurt, salads, or vegetable dishes. Marinate fish or vegetables using olive oil, lemon juice, garlic, and fresh herbs. Meal planning Plan to eat one vegetarian meal one day each week. Try to work up to two vegetarian meals, if possible. Eat seafood two or more times a week. Have healthy snacks readily available, such as: Vegetable sticks with hummus. Greek yogurt. Fruit and nut trail mix. Eat balanced meals throughout the week. This includes: Fruit: 2-3 servings a day. Vegetables: 4-5 servings a day. Low-fat dairy: 2 servings a day. Fish, poultry, or lean meat: 1 serving a day. Beans and legumes: 2 or more servings a week. Nuts and seeds: 1-2 servings a day. Whole grains: 6-8 servings a day. Extra-virgin olive oil: 3-4 servings a day. Limit red meat and sweets to only a few servings a month. Lifestyle  Cook and eat meals together with your family, when possible. Drink enough fluid to keep your urine pale yellow. Be physically active every day. This includes: Aerobic exercise like running or swimming. Leisure activities like gardening, walking, or housework. Get 7-8 hours of sleep each  night. If recommended by your health care provider, drink red wine in moderation. This means 1 glass a day for nonpregnant women and 2 glasses a day for men. A glass of wine equals 5 oz (150 mL). What foods should I eat? Fruits Apples. Apricots. Avocado. Berries. Bananas. Cherries. Dates. Figs. Grapes. Lemons. Melon. Oranges. Peaches. Plums. Pomegranate. Vegetables Artichokes. Beets. Broccoli. Cabbage. Carrots. Eggplant. Green beans. Chard. Kale. Spinach. Onions. Leeks. Peas. Squash. Tomatoes. Peppers. Radishes. Grains Whole-grain  pasta. Brown rice. Bulgur wheat. Polenta. Couscous. Whole-wheat bread. [place]. Meats and other proteins Beans. Almonds. Sunflower seeds. Pine nuts. Peanuts. [place]. Salmon. Scallops. Shrimp. [place]. Tilapia. Clams. Oysters. Eggs. Poultry without skin. Dairy Low-fat milk. Cheese. Greek yogurt. Fats and oils Extra-virgin olive oil. Avocado oil. Grapeseed oil. Beverages Water. Red wine. Herbal tea. Sweets and desserts Greek yogurt with honey. Baked apples. Poached pears. Trail mix. Seasonings and condiments Basil. Cilantro. Coriander. Cumin. Mint. Parsley. Sage. Rosemary. Tarragon. Garlic. Oregano. Thyme. Pepper. Balsamic vinegar. Tahini. Hummus. Tomato sauce. Olives. Mushrooms. The items listed above may not be a complete list of foods and beverages you can eat. Contact a dietitian for more information. What foods should I limit? This is a list of foods that should be eaten rarely or only on special occasions. Fruits Fruit canned in syrup. Vegetables Deep-fried potatoes (french fries). Grains Prepackaged pasta or rice dishes. Prepackaged cereal with added sugar. Prepackaged snacks with added sugar. Meats and other proteins Beef. Pork. Lamb. Poultry with skin. Hot dogs. Berniece Salines. Dairy Ice cream. Sour cream. Whole milk. Fats and oils Butter. Canola oil. Vegetable oil. Beef fat (tallow). Lard. Beverages Juice. Sugar-sweetened soft drinks. Beer. Liquor and spirits. Sweets and desserts Cookies. Cakes. Pies. Candy. Seasonings and condiments Mayonnaise. Pre-made sauces and marinades. The items listed above may not be a complete list of foods and beverages you should limit. Contact a dietitian for more information. Summary The Mediterranean diet includes both food and lifestyle choices. Eat a variety of fresh fruits and vegetables, beans, nuts, seeds, and whole grains. Limit the amount of red meat and sweets that you eat. If recommended by your health care provider, drink red wine in  moderation. This means 1 glass a day for nonpregnant women and 2 glasses a day for men. A glass of wine equals 5 oz (150 mL). This information is not intended to replace advice given to you by your health care provider. Make sure you discuss any questions you have with your health care provider. Document Revised: 11/15/2019 Document Reviewed: 09/12/2019 Elsevier Patient Education  2022 [place].   To prevent palpitations: Make sure you are adequately hydrated.  Avoid and/or limit caffeine containing beverages like soda or tea. Exercise regularly.  Manage stress well. Some over the counter medications can cause palpitations such as Benadryl, AdvilPM, TylenolPM. Regular Advil or Tylenol do not cause palpitations.

## 2022-06-28 ENCOUNTER — Ambulatory Visit (HOSPITAL_BASED_OUTPATIENT_CLINIC_OR_DEPARTMENT_OTHER): Payer: 59 | Admitting: Cardiovascular Disease

## 2022-06-28 ENCOUNTER — Encounter (HOSPITAL_BASED_OUTPATIENT_CLINIC_OR_DEPARTMENT_OTHER): Payer: Self-pay | Admitting: Cardiovascular Disease

## 2022-06-28 VITALS — BP 114/78 | HR 75 | Ht 69.0 in | Wt 222.0 lb

## 2022-06-28 DIAGNOSIS — R079 Chest pain, unspecified: Secondary | ICD-10-CM | POA: Diagnosis not present

## 2022-06-28 DIAGNOSIS — R002 Palpitations: Secondary | ICD-10-CM

## 2022-06-28 DIAGNOSIS — I10 Essential (primary) hypertension: Secondary | ICD-10-CM

## 2022-06-28 DIAGNOSIS — E669 Obesity, unspecified: Secondary | ICD-10-CM

## 2022-06-28 DIAGNOSIS — E66811 Obesity, class 1: Secondary | ICD-10-CM

## 2022-06-28 HISTORY — DX: Obesity, class 1: E66.811

## 2022-06-28 MED ORDER — NEBIVOLOL HCL 5 MG PO TABS: 5.0000 mg | ORAL_TABLET | Freq: Every day | ORAL | 3 refills | Status: DC

## 2022-06-28 NOTE — Patient Instructions (Addendum)
Medication Instructions:  Your physician recommends that you continue on your current medications as directed. Please refer to the Current Medication list given to you today.   Labwork: FASTING LP/CMET/CBC/A1C SOON   Testing/Procedures: NONE  Follow-Up: 6 MONTHS WITH DR [facility]   Any Other Special Instructions Will Be Listed Below (If Applicable).  WILL HAVE HAVE OUR CARE GUIDE REACH OUT TO YOU   Dr. Desma Maxim Scocuzza Dr. Abbe [place] Dr. Dorothyann Peng

## 2022-06-28 NOTE — Progress Notes (Signed)
Cardiology Office Note   Date:  06/28/2022   ID:  ANALEYA Guerra, DOB [birth year], MRN [id number]  PCP:  Patient, No Pcp Per  Cardiologist:   Chilton Si, MD   No chief complaint on file.  History of Present Illness: [name] Guerra is a 41 y.o. female with palpitations and a PFO who presents for follow up.  She has previously been seen for palpitations during pregnancy.  She had an echo in 2019 that revealed LVEF 60 to 65% and a trivial pericardial effusion.  Prior echo revealed a small PFO with left-to-right shunt.  She wore a monitor that revealed episodes of sinus tachycardia but no arrhythmias, though there are some mention of PVCs.  There was discussion on beta-blockers but she did not start morning.  She underwent exercise rest testing which was negative.  She underwent echo last saw Dr. Rennis Golden in 2020.  Prior to that visit she was diagnosed with hypertension and started on olmesartan/HCTZ.  Blood pressure was poorly controlled.  She was also noted to have possible sleep apnea.  Bystolic was added to her regimen and she was referred for sleep study.  Ms. [name] presents today with a chief complaint of palpitations and chest tightness, which she believes may be related to stress. She reports that her symptoms are more pronounced during times of stress and that her heart beats fast and her chest gets tight. She is currently on citalopram, which she has been taking for over a month, but she reports no improvement in her symptoms.   The patient acknowledges a lack of exercise in her daily routine, stating that she is always on the go, gets little sleep, and barely eats. She expresses a desire to lose weight and incorporate exercise into her schedule but is unsure how to do so given her current commitments.  She has a lot of stress and doesn't get much help at home.  She has 6 daughters ranging from 6-22.  [name] has a history of diabetes and previously received injections for  treatment. She is currently searching for a new primary care physician and is due for her screening tests. She consumes caffeine daily in the form of [employer] beverages.   Past Medical History:  Diagnosis Date   Depression    POST PARTUM   Diverticulitis    Dysrhythmia    Gestational diabetes    diet controlled   Hx of Bell's palsy    as newborn   Hypertension    Kidney stone    Lymphadenitis    LEFT LOWER LEG   Obesity (BMI 30.0-34.9) 06/28/2022   SVD (spontaneous vaginal delivery) 05/24/2012    Past Surgical History:  Procedure Laterality Date   BREAST CYST EXCISION     LAPAROSCOPIC TUBAL LIGATION Bilateral 01/15/2016   Procedure: LAPAROSCOPIC TUBAL LIGATION;  Surgeon: Huel Cote, MD;  Location: [facility];  Service: Gynecology;  Laterality: Bilateral;   TRANSTHORACIC ECHOCARDIOGRAM  02/09/2012   EF=55%, Patent foramen ovale is present, Doppler suggest left to right interatrial shunt   WISDOM TOOTH EXTRACTION       Current Outpatient Medications  Medication Sig Dispense Refill   citalopram (CELEXA) 20 MG tablet Take 20 mg by mouth daily.     olmesartan-hydrochlorothiazide (BENICAR HCT) 40-25 MG tablet Take 1 tablet by mouth daily. 90 tablet 3   nebivolol (BYSTOLIC) 5 MG tablet Take 1 tablet (5 mg total) by mouth daily. 90 tablet 3   No current facility-administered medications for this  visit.    Allergies:   Iron and Lisinopril    Social History:  The patient  reports that she has never smoked. She has never used smokeless tobacco. She reports current alcohol use. She reports that she does not use drugs.   Family History:  The patient's family history includes Cancer in her maternal uncle; Diabetes in her maternal aunt, maternal grandfather, maternal grandmother, maternal uncle, and mother; Heart disease in her maternal aunt and maternal grandfather; Hypertension in her maternal grandfather.    ROS:  Please see the history of present illness.   Otherwise, review of  systems are positive for none.   All other systems are reviewed and negative.    PHYSICAL EXAM: VS:  BP 114/78   Pulse 75   Ht 5\' 9"  (1.753 m)   Wt 222 lb (100.7 kg)   BMI 32.78 kg/m  , BMI Body mass index is 32.78 kg/m. GENERAL:  Well appearing HEENT:  Pupils equal round and reactive, fundi not visualized, oral mucosa unremarkable NECK:  No jugular venous distention, waveform within normal limits, carotid upstroke brisk and symmetric, no bruits, no thyromegaly LUNGS:  Clear to auscultation bilaterally HEART:  RRR.  PMI not displaced or sustained,S1 and S2 within normal limits, no S3, no S4, no clicks, no rubs, no murmurs ABD:  Flat, positive bowel sounds normal in frequency in pitch, no bruits, no rebound, no guarding, no midline pulsatile mass, no hepatomegaly, no splenomegaly EXT:  2 plus pulses throughout, no edema, no cyanosis no clubbing SKIN:  No rashes no nodules NEURO:  Cranial nerves II through XII grossly intact, motor grossly intact throughout PSYCH:  Cognitively intact, oriented to person place and time  EKG:  EKG is ordered today. The ekg ordered today demonstrates sinus rhythm.  Rate 75 bpm.  Non-specific T wave abnormalities  Echo 04/2014: Study Conclusions   - Left ventricle: The cavity size was normal. Systolic function was    normal. The estimated ejection fraction was in the range of 60%    to 65%.  - Pericardium, extracardiac: A trivial pericardial effusion was    identified.     Recent Labs: 09/23/2021: BUN 11; Creatinine, Ser 0.79; Hemoglobin 12.2; Platelets 201; Potassium 4.2; Sodium 137    Lipid Panel No results found for: "CHOL", "TRIG", "HDL", "CHOLHDL", "VLDL", "LDLCALC", "LDLDIRECT"    Wt Readings from Last 3 Encounters:  06/28/22 222 lb (100.7 kg)  01/03/22 230 lb 14.4 oz (104.7 kg)  11/01/21 237 lb 9.6 oz (107.8 kg)      ASSESSMENT AND PLAN:  #. Palpitations and chest tightness:  Atypical and non-exertional.  No ischemic evaluation at  this time.    - Continue nabivolol for palpitatioins and BP    - Reevaluate the effectiveness of citalopram for anxiety and consider alternative medications if necessary    - Encourage stress management techniques and we will refer her to our Care Guide    - Limit caffeine intake.  She inquired about diet pills.  I would advise against phentermine.   # Pre-diabetes:    - Check hemoglobin A1C    - Encourage weight loss and exercise as tolerated.  She will start walking after she drops her daughters off at school    - Reestablish care with a primary care physician for ongoing management    - Consider discussing newer medications like Ozempic or Wegovy if appropriate and covered by insurance  # Hypertension:    - Controlled on nebivolol, olmesartan and HCTZ  #  Obesity:    - Encourage a balanced diet and regular exercise, taking into account patient's schedule and previous Achilles tendon injury    - Avoid diet pills due to potential exacerbation of palpitations    - Consider discussing newer medications like Ozempic or Wegovy if appropriate and covered by insurance    - Reestablish care with a primary care physician for ongoing management and support  # Preventive care:    - Reestablish care with a primary care physician for routine screening tests    - Check cholesterol, kidney function, liver function, A1c, lipids   Current medicines are reviewed at length with the patient today.  The patient does not have concerns regarding medicines.  The following changes have been made:  no change  Labs/ tests ordered today include:   Orders Placed This Encounter  Procedures   EKG 12-Lead     Disposition:   FU with Equilla Que C. Duke Salvia, MD, [facility] in 6 months.     Signed, Hakeen Shipes C. Duke Salvia, MD, [facility]  06/28/2022 10:45 AM    [facility]

## 2022-06-28 NOTE — Addendum Note (Signed)
Addended by: Regis Bill B on: 06/28/2022 12:45 PM   Modules accepted: Orders

## 2022-06-30 ENCOUNTER — Telehealth: Payer: Self-pay

## 2022-06-30 DIAGNOSIS — Z0000 Encounter for general adult medical examination without abnormal findings: Secondary | ICD-10-CM

## 2022-06-30 NOTE — Telephone Encounter (Signed)
Called patient per referral from Dr. Duke Salvia for health coaching regarding stress management. Left message for patient to return call to discuss health coaching in detail and determine her interest.   Lynnell Fiumara Nedra Hai, [facility] Guide, Health Coach [address] [place] [address] Telephone: [phone] Email: [email or web address]

## 2022-07-06 ENCOUNTER — Telehealth: Payer: Self-pay

## 2022-07-06 DIAGNOSIS — Z0000 Encounter for general adult medical examination without abnormal findings: Secondary | ICD-10-CM

## 2022-07-06 NOTE — Telephone Encounter (Signed)
Called patient again per referral from Dr. Duke Salvia for health coaching. Left message for patient to return call.    [name] Guerra [facility] Guide, Health Coach [address] [place] [address] Telephone: [phone] Email: [name]

## 2022-09-27 ENCOUNTER — Other Ambulatory Visit (HOSPITAL_BASED_OUTPATIENT_CLINIC_OR_DEPARTMENT_OTHER): Payer: Self-pay | Admitting: Nurse Practitioner

## 2023-02-09 ENCOUNTER — Encounter: Payer: Self-pay | Admitting: *Deleted

## 2023-07-06 ENCOUNTER — Encounter (HOSPITAL_BASED_OUTPATIENT_CLINIC_OR_DEPARTMENT_OTHER): Payer: Self-pay | Admitting: Cardiovascular Disease

## 2023-07-06 ENCOUNTER — Other Ambulatory Visit (HOSPITAL_BASED_OUTPATIENT_CLINIC_OR_DEPARTMENT_OTHER): Payer: Self-pay

## 2023-07-06 ENCOUNTER — Ambulatory Visit (HOSPITAL_BASED_OUTPATIENT_CLINIC_OR_DEPARTMENT_OTHER): Payer: Medicaid Other | Admitting: Cardiovascular Disease

## 2023-07-06 VITALS — BP 138/92 | HR 80 | Ht 69.0 in | Wt 216.0 lb

## 2023-07-06 DIAGNOSIS — I10 Essential (primary) hypertension: Secondary | ICD-10-CM | POA: Diagnosis not present

## 2023-07-06 DIAGNOSIS — E669 Obesity, unspecified: Secondary | ICD-10-CM

## 2023-07-06 DIAGNOSIS — F419 Anxiety disorder, unspecified: Secondary | ICD-10-CM | POA: Diagnosis not present

## 2023-07-06 DIAGNOSIS — F329 Major depressive disorder, single episode, unspecified: Secondary | ICD-10-CM

## 2023-07-06 DIAGNOSIS — R002 Palpitations: Secondary | ICD-10-CM

## 2023-07-06 DIAGNOSIS — F32A Depression, unspecified: Secondary | ICD-10-CM | POA: Insufficient documentation

## 2023-07-06 HISTORY — DX: Anxiety disorder, unspecified: F41.9

## 2023-07-06 MED ORDER — OLMESARTAN MEDOXOMIL-HCTZ 40-25 MG PO TABS
1.0000 | ORAL_TABLET | Freq: Every day | ORAL | 3 refills | Status: DC
  Filled 2023-07-06: qty 90, 90d supply, fill #0
  Filled 2023-11-09: qty 90, 90d supply, fill #1

## 2023-07-06 MED ORDER — NEBIVOLOL HCL 5 MG PO TABS
5.0000 mg | ORAL_TABLET | Freq: Every day | ORAL | 3 refills | Status: DC
  Filled 2023-07-06 – 2024-02-20 (×4): qty 90, 90d supply, fill #0

## 2023-07-06 MED ORDER — CITALOPRAM HYDROBROMIDE 20 MG PO TABS
20.0000 mg | ORAL_TABLET | Freq: Every day | ORAL | 1 refills | Status: DC
  Filled 2023-07-06 (×2): qty 90, 90d supply, fill #0
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
## 2023-07-06 NOTE — Patient Instructions (Addendum)
Medication Instructions:  START CITALOPRAM 20 MG DAILY AS PREVIOUSLY PRESCRIBED   *If you need a refill on your cardiac medications before your next appointment, please call your pharmacy*  Lab Work: CMET/CBC/MAGNESIUM/TSH/FT4 TODAY   If you have labs (blood work) drawn today and your tests are completely normal, you will receive your results only by: MyChart Message (if you have MyChart) OR A paper copy in the mail If you have any lab test that is abnormal or we need to change your treatment, we will call you to review the results.  Testing/Procedures: Your physician has recommended that you wear an event monitor. Event monitors are medical devices that record the heart's electrical activity. Doctors most often [place] these monitors to diagnose arrhythmias. Arrhythmias are problems with the speed or rhythm of the heartbeat. The monitor is a small, portable device. You can wear one while you do your normal daily activities. This is usually used to diagnose what is causing palpitations/syncope (passing out). 30 DAYS, THIS WILL BE MAILED TO YOU   Follow-Up: At [facility], you and your health needs are our priority.  As part of our continuing mission to provide you with exceptional heart care, we have created designated Provider Care Teams.  These Care Teams include your primary Cardiologist (physician) and Advanced Practice Providers (APPs -  Physician Assistants and Nurse Practitioners) who all work together to provide you with the care you need, when you need it.  We recommend signing up for the patient portal called "MyChart".  Sign up information is provided on this After Visit Summary.  MyChart is used to connect with patients for Virtual Visits (Telemedicine).  Patients are able to view lab/test results, encounter notes, upcoming appointments, etc.  Non-urgent messages can be sent to your provider as well.   To learn more about what you can do with MyChart, go to  [email or web address].    Your next appointment:   3 month(s)  Provider:   Chilton Si, MD or Gillian Shields, NP    Other Instructions [employer] Cardiac Event Monitor Instructions  Your physician has requested you wear your cardiac event monitor for _30_ days, (1-30). [employer] may call or text to confirm a shipping address. The monitor will be sent to a land address via UPS. [employer] will not ship a monitor to a PO BOX. It typically takes 3-5 days to receive your monitor after it has been enrolled. [employer] will assist with USPS tracking if your package is delayed. The telephone number for [employer] is [phone]. Once you have received your monitor, please review the enclosed instructions. Instruction tutorials can also be viewed under help and settings on the enclosed cell phone. Your monitor has already been registered assigning a specific monitor serial # to you.  Billing and Self Pay Discount Information  [employer] has been provided the insurance information we had on file for you.  If your insurance has been updated, please call [employer] at [phone] to provide them with your updated insurance information.   [employer] offers a discounted Self Pay option for patients who have insurance that does not cover their cardiac event monitor or patients without insurance.  The discounted cost of a Self Pay Cardiac Event Monitor would be $225.00 , if the patient contacts [employer] at [phone] within 7 days of applying the monitor to make payment arrangements.  If the patient does not contact [employer] within 7 days of applying the monitor, the cost of the cardiac event monitor will be $350.00.  Applying the  monitor  Remove cell phone from case and turn it on. The cell phone works as IT consultant and needs to be within [employer] of you at all times. The cell phone will need to be charged on a daily basis. We recommend you plug the cell phone into the enclosed  charger at your bedside table every night.  Monitor batteries: You will receive two monitor batteries labelled #1 and #2. These are your recorders. Plug battery #2 onto the second connection on the enclosed charger. Keep one battery on the charger at all times. This will keep the monitor battery deactivated. It will also keep it fully charged for when you need to switch your monitor batteries. A small light will be blinking on the battery emblem when it is charging. The light on the battery emblem will remain on when the battery is fully charged.  Open package of a Monitor strip. Insert battery #1 into black hood on strip and gently squeeze monitor battery onto connection as indicated in instruction booklet. Set aside while preparing skin.  Choose location for your strip, vertical or horizontal, as indicated in the instruction booklet. Shave to remove all hair from location. There cannot be any lotions, oils, powders, or colognes on skin where monitor is to be applied. Wipe skin clean with enclosed Saline wipe. Dry skin completely.  Peel paper labeled #1 off the back of the Monitor strip exposing the adhesive. Place the monitor on the chest in the vertical or horizontal position shown in the instruction booklet. One arrow on the monitor strip must be pointing upward. Carefully remove paper labeled #2, attaching remainder of strip to your skin. Try not to create any folds or wrinkles in the strip as you apply it.  Firmly press and release the circle in the center of the monitor battery. You will hear a small beep. This is turning the monitor battery on. The heart emblem on the monitor battery will light up every 5 seconds if the monitor battery in turned on and connected to the patient securely. Do not push and hold the circle down as this turns the monitor battery off. The cell phone will locate the monitor battery. A screen will appear on the cell phone checking the connection of your  monitor strip. This may read poor connection initially but change to good connection within the next minute. Once your monitor accepts the connection you will hear a series of 3 beeps followed by a climbing crescendo of beeps. A screen will appear on the cell phone showing the two monitor strip placement options. Touch the picture that demonstrates where you applied the monitor strip.  Your monitor strip and battery are waterproof. You are able to shower, bathe, or swim with the monitor on. They just ask you do not submerge deeper than 3 feet underwater. We recommend removing the monitor if you are swimming in a lake, river, or ocean.  Your monitor battery will need to be switched to a fully charged monitor battery approximately once a week. The cell phone will alert you of an action which needs to be made.  On the cell phone, tap for details to reveal connection status, monitor battery status, and cell phone battery status. The green dots indicates your monitor is in good status. A red dot indicates there is something that needs your attention.  To record a symptom, click the circle on the monitor battery. In 30-60 seconds a list of symptoms will appear on the cell phone. Select  your symptom and tap save. Your monitor will record a sustained or significant arrhythmia regardless of you clicking the button. Some patients do not feel the heart rhythm irregularities. [employer] will notify us of any serious or critical events.  Refer to instruction booklet for instructions on switching batteries, changing strips, the Do not disturb or Pause features, or any additional questions.  Call [employer] at [phone], to confirm your monitor is transmitting and record your baseline. They will answer any questions you may have regarding the monitor instructions at that time.  Returning the monitor to [employer]  Place all equipment back into blue box. Peel off strip of paper to expose adhesive  and close box securely. There is a prepaid UPS shipping label on this box. Drop in a UPS drop box, or at a UPS facility like [employer]. You may also contact [employer] to arrange UPS to pick up monitor package at your home.

## 2023-07-07 LAB — CBC WITH DIFFERENTIAL/PLATELET
Basophils Absolute: 0 10*3/uL (ref 0.0–0.2)
Basos: 1 %
EOS (ABSOLUTE): 0.1 10*3/uL (ref 0.0–0.4)
Eos: 3 %
Hematocrit: 40 % (ref 34.0–46.6)
Hemoglobin: 13.3 g/dL (ref 11.1–15.9)
Immature Grans (Abs): 0 10*3/uL (ref 0.0–0.1)
Immature Granulocytes: 0 %
Lymphocytes Absolute: 2 10*3/uL (ref 0.7–3.1)
Lymphs: 47 %
MCH: 28.7 pg (ref 26.6–33.0)
MCHC: 33.3 g/dL (ref 31.5–35.7)
MCV: 86 fL (ref 79–97)
Monocytes Absolute: 0.4 10*3/uL (ref 0.1–0.9)
Monocytes: 9 %
Neutrophils Absolute: 1.7 10*3/uL (ref 1.4–7.0)
Neutrophils: 40 %
Platelets: 233 10*3/uL (ref 150–450)
RBC: 4.64 x10E6/uL (ref 3.77–5.28)
RDW: 13.8 % (ref 11.7–15.4)
WBC: 4.2 10*3/uL (ref 3.4–10.8)

## 2023-07-07 LAB — COMPREHENSIVE METABOLIC PANEL
ALT: 10 IU/L (ref 0–32)
AST: 13 IU/L (ref 0–40)
Albumin: 3.9 g/dL (ref 3.9–4.9)
Alkaline Phosphatase: 74 IU/L (ref 44–121)
BUN/Creatinine Ratio: 14 (ref 9–23)
BUN: 11 mg/dL (ref 6–24)
Bilirubin Total: 0.4 mg/dL (ref 0.0–1.2)
CO2: 24 mmol/L (ref 20–29)
Calcium: 9.2 mg/dL (ref 8.7–10.2)
Chloride: 103 mmol/L (ref 96–106)
Creatinine, Ser: 0.79 mg/dL (ref 0.57–1.00)
Globulin, Total: 2.9 g/dL (ref 1.5–4.5)
Glucose: 84 mg/dL (ref 70–99)
Potassium: 4.3 mmol/L (ref 3.5–5.2)
Sodium: 140 mmol/L (ref 134–144)
Total Protein: 6.8 g/dL (ref 6.0–8.5)
eGFR: 96 mL/min/{1.73_m2} (ref >59)

## 2023-07-07 LAB — TSH: TSH: 0.712 u[IU]/mL (ref 0.450–4.500)

## 2023-07-07 LAB — T4, FREE: Free T4: 1.16 ng/dL (ref 0.82–1.77)

## 2023-07-07 LAB — MAGNESIUM: Magnesium: 2.2 mg/dL (ref 1.6–2.3)

## 2023-07-10 ENCOUNTER — Other Ambulatory Visit (HOSPITAL_BASED_OUTPATIENT_CLINIC_OR_DEPARTMENT_OTHER): Payer: Self-pay

## 2023-07-11 ENCOUNTER — Other Ambulatory Visit (HOSPITAL_COMMUNITY): Payer: Self-pay

## 2023-07-11 ENCOUNTER — Telehealth: Payer: Self-pay

## 2023-07-11 NOTE — Telephone Encounter (Signed)
Pharmacy Patient Advocate Encounter   Received notification from CoverMyMeds that prior authorization for NEBIVOLOL is required/requested.   Insurance verification completed.   The patient is insured through [facility] .   Per test claim: PA required; PA submitted to [employer] via CoverMyMeds Key/confirmation #/EOC [id number] Status is pending

## 2023-07-13 ENCOUNTER — Other Ambulatory Visit (HOSPITAL_COMMUNITY): Payer: Self-pay

## 2023-07-13 NOTE — Telephone Encounter (Signed)
Pharmacy Patient Advocate Encounter  Received notification from [facility] that Prior Authorization for NEBIVOLOL has been APPROVED from 07/12/23 to 07/11/24. Ran test claim, Copay is $4. This test claim was processed through [facility]- copay amounts may vary at other pharmacies due to pharmacy/plan contracts, or as the patient moves through the different stages of their insurance plan.

## 2023-07-16 ENCOUNTER — Other Ambulatory Visit (HOSPITAL_BASED_OUTPATIENT_CLINIC_OR_DEPARTMENT_OTHER): Payer: Self-pay

## 2023-07-17 ENCOUNTER — Other Ambulatory Visit (HOSPITAL_BASED_OUTPATIENT_CLINIC_OR_DEPARTMENT_OTHER): Payer: Self-pay

## 2023-07-18 ENCOUNTER — Encounter (HOSPITAL_BASED_OUTPATIENT_CLINIC_OR_DEPARTMENT_OTHER): Payer: Self-pay

## 2023-07-18 ENCOUNTER — Telehealth (HOSPITAL_BASED_OUTPATIENT_CLINIC_OR_DEPARTMENT_OTHER): Payer: Self-pay

## 2023-07-18 ENCOUNTER — Telehealth (HOSPITAL_BASED_OUTPATIENT_CLINIC_OR_DEPARTMENT_OTHER): Payer: Self-pay | Admitting: Cardiovascular Disease

## 2023-07-18 NOTE — Telephone Encounter (Signed)
Follow Up:     Patientis returning a call from this morning.

## 2023-07-18 NOTE — Telephone Encounter (Signed)
Returned call to patient, scheduled for nurse visit 9/26

## 2023-07-18 NOTE — Telephone Encounter (Addendum)
2nd call attempt, Left message for patient to call back    ----- Message from [facility] sent at 07/09/2023  6:25 PM EDT ----- Labs are all normal.  Normal blood counts, kidney function, liver function, thyroid and electrolytes.

## 2023-07-18 NOTE — Telephone Encounter (Signed)
This encounter was created in error - please disregard.

## 2023-07-18 NOTE — Telephone Encounter (Signed)
Advised patient of lab results

## 2023-07-18 NOTE — Telephone Encounter (Signed)
New Message:     Patient said she received her Monitor. She wants  to know if she can come by and get someone to assist her in putting it on please?

## 2023-07-20 ENCOUNTER — Ambulatory Visit (HOSPITAL_BASED_OUTPATIENT_CLINIC_OR_DEPARTMENT_OTHER): Payer: Medicaid Other

## 2023-07-27 ENCOUNTER — Other Ambulatory Visit (HOSPITAL_BASED_OUTPATIENT_CLINIC_OR_DEPARTMENT_OTHER): Payer: Self-pay

## 2023-09-10 ENCOUNTER — Emergency Department (HOSPITAL_BASED_OUTPATIENT_CLINIC_OR_DEPARTMENT_OTHER)
Admission: EM | Admit: 2023-09-10 | Discharge: 2023-09-10 | Disposition: A | Discharge status: Home / Self Care | Payer: Medicaid Other | Attending: Emergency Medicine | Admitting: Emergency Medicine

## 2023-09-10 ENCOUNTER — Other Ambulatory Visit: Payer: Self-pay

## 2023-09-10 ENCOUNTER — Encounter (HOSPITAL_BASED_OUTPATIENT_CLINIC_OR_DEPARTMENT_OTHER): Payer: Self-pay | Admitting: Emergency Medicine

## 2023-09-10 ENCOUNTER — Emergency Department (HOSPITAL_BASED_OUTPATIENT_CLINIC_OR_DEPARTMENT_OTHER): Payer: Medicaid Other

## 2023-09-10 DIAGNOSIS — M79606 Pain in leg, unspecified: Secondary | ICD-10-CM | POA: Diagnosis not present

## 2023-09-10 DIAGNOSIS — R109 Unspecified abdominal pain: Secondary | ICD-10-CM | POA: Insufficient documentation

## 2023-09-10 DIAGNOSIS — I10 Essential (primary) hypertension: Secondary | ICD-10-CM | POA: Insufficient documentation

## 2023-09-10 LAB — URINALYSIS, ROUTINE W REFLEX MICROSCOPIC
Bilirubin Urine: NEGATIVE
Glucose, UA: NEGATIVE mg/dL
Hgb urine dipstick: NEGATIVE
Ketones, ur: NEGATIVE mg/dL
Leukocytes,Ua: NEGATIVE
Nitrite: NEGATIVE
Protein, ur: NEGATIVE mg/dL
Specific Gravity, Urine: >=1.03 (ref 1.005–1.030)
pH: 5.5 (ref 5.0–8.0)

## 2023-09-10 LAB — CBC
HCT: 35.8 % — ABNORMAL LOW (ref 36.0–46.0)
Hemoglobin: 11.7 g/dL — ABNORMAL LOW (ref 12.0–15.0)
MCH: 28.6 pg (ref 26.0–34.0)
MCHC: 32.7 g/dL (ref 30.0–36.0)
MCV: 87.5 fL (ref 80.0–100.0)
Platelets: 184 10*3/uL (ref 150–400)
RBC: 4.09 MIL/uL (ref 3.87–5.11)
RDW: 13.3 % (ref 11.5–15.5)
WBC: 5 10*3/uL (ref 4.0–10.5)
nRBC: 0 % (ref 0.0–0.2)

## 2023-09-10 LAB — LIPASE, BLOOD: Lipase: 38 U/L (ref 11–51)

## 2023-09-10 LAB — COMPREHENSIVE METABOLIC PANEL
ALT: 13 U/L (ref 0–44)
AST: 14 U/L — ABNORMAL LOW (ref 15–41)
Albumin: 3.6 g/dL (ref 3.5–5.0)
Alkaline Phosphatase: 66 U/L (ref 38–126)
Anion gap: 9 (ref 5–15)
BUN: 14 mg/dL (ref 6–20)
CO2: 25 mmol/L (ref 22–32)
Calcium: 8.8 mg/dL — ABNORMAL LOW (ref 8.9–10.3)
Chloride: 105 mmol/L (ref 98–111)
Creatinine, Ser: 0.76 mg/dL (ref 0.44–1.00)
GFR, Estimated: >60 mL/min (ref >60)
Glucose, Bld: 112 mg/dL — ABNORMAL HIGH (ref 70–99)
Potassium: 3.2 mmol/L — ABNORMAL LOW (ref 3.5–5.1)
Sodium: 139 mmol/L (ref 135–145)
Total Bilirubin: 0.5 mg/dL (ref <1.2)
Total Protein: 6.9 g/dL (ref 6.5–8.1)

## 2023-09-10 LAB — PREGNANCY, URINE: Preg Test, Ur: NEGATIVE

## 2023-09-10 MED ORDER — NAPROXEN 500 MG PO TABS: 500.0000 mg | ORAL_TABLET | Freq: Two times a day (BID) | ORAL | 0 refills | Status: DC

## 2023-09-10 MED ORDER — METHOCARBAMOL 500 MG PO TABS: 500.0000 mg | ORAL_TABLET | Freq: Three times a day (TID) | ORAL | 0 refills | Status: DC | PRN

## 2023-09-10 MED ORDER — NAPROXEN 250 MG PO TABS
500.0000 mg | ORAL_TABLET | Freq: Once | ORAL | Status: AC
  Administered 2023-09-10: 500 mg via ORAL
  Filled 2023-09-10: qty 2

## 2023-09-10 NOTE — Discharge Instructions (Signed)
You were evaluated in the Emergency Department and after careful evaluation, we did not find any emergent condition requiring admission or further testing in the hospital.  Your exam/testing today was overall reassuring.  Symptoms likely due to muscle strain or spasm.  Recommend using the Naprosyn twice daily as prescribed for pain.  Can use the Robaxin muscle relaxer for more significant pain, best used at night if you are having trouble sleeping as it can cause drowsiness.  Please return to the Emergency Department if you experience any worsening of your condition.  Thank you for allowing [place] to be a part of your care.

## 2023-09-10 NOTE — ED Triage Notes (Signed)
L flank pain x 4-5 days worse with movement. Pt denies any urinary sx.

## 2023-09-10 NOTE — ED Provider Notes (Signed)
[facility]ERGENCY DEPT [facility] Emergency Department Provider Note MRN:  [id number]  Arrival date & time: 09/10/23     Chief Complaint   Flank Pain   History of Present Illness   [name] Guerra is a 42 y.o. year-old female with a history of hypertension, acute discharge presenting to the ED with chief complaint of leg pain,.  Left flank pain not going away for the past 4 to 5 days.  Worse with movement but also feels similar to prior kidney stones.  Has required stents in the past.  Denies fever, no burning with urination.  Review of Systems  A thorough review of systems was obtained and all systems are negative except as noted in the HPI and PMH.   Patient's Health History    Past Medical History:  Diagnosis Date   Anxiety 07/06/2023   Depression    POST PARTUM   Diverticulitis    Dysrhythmia    Gestational diabetes    diet controlled   Hx of Bell's palsy    as newborn   Hypertension    Kidney stone    Lymphadenitis    LEFT LOWER LEG   Obesity (BMI 30.0-34.9) 06/28/2022   SVD (spontaneous vaginal delivery) 05/24/2012    Past Surgical History:  Procedure Laterality Date   BREAST CYST EXCISION     LAPAROSCOPIC TUBAL LIGATION Bilateral 01/15/2016   Procedure: LAPAROSCOPIC TUBAL LIGATION;  Surgeon: Huel Cote, MD;  Location: [facility];  Service: Gynecology;  Laterality: Bilateral;   TRANSTHORACIC ECHOCARDIOGRAM  02/09/2012   EF=55%, Patent foramen ovale is present, Doppler suggest left to right interatrial shunt   WISDOM TOOTH EXTRACTION      Family History  Problem Relation Age of Onset   Diabetes Mother    Heart disease Maternal Aunt    Diabetes Maternal Aunt    Diabetes Maternal Uncle    Cancer Maternal Uncle        bone   Diabetes Maternal Grandmother    Hypertension Maternal Grandfather    Heart disease Maternal Grandfather    Diabetes Maternal Grandfather    Anesthesia problems Neg Hx     Social History   Socioeconomic History    Marital status: Married    Spouse name: Not on file   Number of children: Not on file   Years of education: Not on file   Highest education level: Not on file  Occupational History   Not on file  Tobacco Use   Smoking status: Never   Smokeless tobacco: Never  Vaping Use   Vaping status: Never Used  Substance and Sexual Activity   Alcohol use: Yes    Comment: occasional   Drug use: No   Sexual activity: Yes    Birth control/protection: Surgical  Other Topics Concern   Not on file  Social History Narrative   Not on file   Social Determinants of Health   Financial Resource Strain: Not on file  Food Insecurity: Not on file  Transportation Needs: Not on file  Physical Activity: Not on file  Stress: Not on file  Social Connections: Not on file  Intimate Partner Violence: Not on file     Physical Exam   Vitals:   09/10/23 0231  BP: (!) 135/91  Pulse: 81  Resp: 18  Temp: 98 F (36.7 C)  SpO2: 100%    CONSTITUTIONAL: Well-appearing, NAD NEURO/PSYCH:  Alert and oriented x 3, no focal deficits EYES:  eyes equal and reactive ENT/NECK:  no LAD, no  JVD CARDIO: Regular rate, well-perfused, normal S1 and S2 PULM:  CTAB no wheezing or rhonchi GI/GU:  non-distended, non-tender MSK/SPINE:  No gross deformities, no edema SKIN:  no rash, atraumatic   *Additional and/or pertinent findings included in MDM below  Diagnostic and Interventional Summary    EKG Interpretation Date/Time:    Ventricular Rate:    PR Interval:    QRS Duration:    QT Interval:    QTC Calculation:   R Axis:      Text Interpretation:         Labs Reviewed  CBC - Abnormal; Notable for the following components:      Result Value   Hemoglobin 11.7 (*)    HCT 35.8 (*)    All other components within normal limits  COMPREHENSIVE METABOLIC PANEL - Abnormal; Notable for the following components:   Potassium 3.2 (*)    Glucose, Bld 112 (*)    Calcium 8.8 (*)    AST 14 (*)    All other  components within normal limits  URINALYSIS, ROUTINE W REFLEX MICROSCOPIC - Abnormal; Notable for the following components:   APPearance CLOUDY (*)    All other components within normal limits  LIPASE, BLOOD  PREGNANCY, URINE    CT RENAL STONE STUDY  Final Result      Medications  naproxen (NAPROSYN) tablet 500 mg (500 mg Oral Given 09/10/23 0250)     Procedures  /  Critical Care Procedures  ED Course and Medical Decision Making  Initial Impression and Ddx Possibly just MSK related flank pain however history of large stones requiring procedural intervention, persistent pain without an obvious cause of musculoskeletal strain.  Awaiting labs, CT.  Past medical/surgical history that increases complexity of ED encounter: Kidney stone  Interpretation of Diagnostics I personally reviewed the laboratory assessment and my interpretation is as follows: No significant blood count or electrolyte disturbance  Urinalysis normal, CT unremarkable.  Patient Reassessment and Ultimate Disposition/Management     Discharge  Patient management required discussion with the following services or consulting groups:  None  Complexity of Problems Addressed Acute illness or injury that poses threat of life of bodily function  Additional Data Reviewed and Analyzed Further history obtained from: Prior labs/imaging results  Additional Factors Impacting ED Encounter Risk Prescriptions  Elmer Sow. Pilar Plate, MD [facility] Emergency Medicine [facility] [email or web address]  Final Clinical Impressions(s) / ED Diagnoses     ICD-10-CM   1. Flank pain  R10.9       ED Discharge Orders          Ordered    naproxen (NAPROSYN) 500 MG tablet  2 times daily        09/10/23 0348    methocarbamol (ROBAXIN) 500 MG tablet  Every 8 hours PRN        09/10/23 0348             Discharge Instructions Discussed with and Provided to Patient:     Discharge Instructions      You  were evaluated in the Emergency Department and after careful evaluation, we did not find any emergent condition requiring admission or further testing in the hospital.  Your exam/testing today was overall reassuring.  Symptoms likely due to muscle strain or spasm.  Recommend using the Naprosyn twice daily as prescribed for pain.  Can use the Robaxin muscle relaxer for more significant pain, best used at night if you are having trouble sleeping as it can cause drowsiness.  Please return to the Emergency Department if you experience any worsening of your condition.  Thank you for allowing [place] to be a part of your care.        Sabas Sous, MD 09/10/23 [phone]

## 2023-10-09 ENCOUNTER — Telehealth: Payer: Self-pay | Admitting: Cardiovascular Disease

## 2023-10-09 NOTE — Telephone Encounter (Signed)
Spoke with patient regarding her heart rate Per patient it has been jumping all over the place all day since she woke up  Is trying to stay well hydrated, only drinking water mostly, avoids caffeine  HR will be 60 and jump to 140's while doing nothing Does have Apple watch that checks HR only, that is how she knows [name] Guerra with monitor team said the monitor enrollment expires after 90 days so patient would send back and have another mailed. [name] said she could get her scheduled at [facility] office to get placed Stated she does feel like she has no energy, does think she is having a little shortness of breath   Will forward to [place] for review

## 2023-10-09 NOTE — Telephone Encounter (Signed)
Left message to call back  At last visit with event monitor ordered and mailed to patient Patient was scheduled for nurse visit to place monitor, no show Does have a lot of stressors per Dr Leonides Sake last visit  Checking with monitor team to see if she can still place monitor

## 2023-10-09 NOTE — Telephone Encounter (Signed)
Left message to call back and my chart message sent.

## 2023-10-09 NOTE — Telephone Encounter (Signed)
TSH/BMP/mag/CBC were normal 06/2023 when Dr. Duke Salvia checked them but if she wanted to have them rechecked we certainly could. Recommend pursed lip breathing to help relax, reduce the stress surrounding palpitations (directions below). If still with stressors, recommend discussion of anxiety with PCP. Avoid caffeine, stay well hydrated.  [name] Sorrow, NP  _____  Pursed Lip Breathing Pursed lip breathing is a technique to relieve the feeling of being short of breath.  Being short of breath can make you tense and anxious. Before you start this breathing exercise, take a minute to relax your shoulders and close your eyes. Then: Start the exercise by closing your mouth. Breathe in through your nose, taking a normal breath. You can do this at your normal rate of breathing. If you feel you are not getting enough air, breathe in while slowly counting to 2 or 3. Pucker (purse) your lips as if you were going to whistle. Gently tighten the muscles of your abdomenor press on your abdomen to help push the air out. Breathe out slowly through your pursed lips. Take at least twice as long to breathe out as it takes you to breathe in. Make sure that you breathe out all of the air, but do not force air out.

## 2023-10-09 NOTE — Telephone Encounter (Signed)
STAT if HR is under 50 or over 120 (normal HR is 60-100 beats per minute)  What is your heart rate? 150  Do you have a log of your heart rate readings (document readings)? no  Do you have any other symptoms? Heart rate jumping up and down all day

## 2023-10-10 ENCOUNTER — Other Ambulatory Visit: Payer: Self-pay | Admitting: *Deleted

## 2023-10-10 DIAGNOSIS — R002 Palpitations: Secondary | ICD-10-CM

## 2023-10-19 DIAGNOSIS — R002 Palpitations: Secondary | ICD-10-CM | POA: Diagnosis not present

## 2023-11-01 ENCOUNTER — Ambulatory Visit: Payer: Medicaid Other | Attending: Cardiovascular Disease

## 2023-11-01 DIAGNOSIS — R002 Palpitations: Secondary | ICD-10-CM

## 2023-11-09 ENCOUNTER — Other Ambulatory Visit: Payer: Self-pay

## 2023-11-09 ENCOUNTER — Other Ambulatory Visit (HOSPITAL_BASED_OUTPATIENT_CLINIC_OR_DEPARTMENT_OTHER): Payer: Self-pay

## 2023-11-10 ENCOUNTER — Other Ambulatory Visit: Payer: Self-pay

## 2023-11-10 ENCOUNTER — Other Ambulatory Visit (HOSPITAL_BASED_OUTPATIENT_CLINIC_OR_DEPARTMENT_OTHER): Payer: Self-pay

## 2023-11-13 ENCOUNTER — Encounter (HOSPITAL_BASED_OUTPATIENT_CLINIC_OR_DEPARTMENT_OTHER): Payer: Self-pay | Admitting: Cardiovascular Disease

## 2023-11-20 ENCOUNTER — Other Ambulatory Visit (HOSPITAL_BASED_OUTPATIENT_CLINIC_OR_DEPARTMENT_OTHER): Payer: Self-pay

## 2023-11-21 NOTE — Telephone Encounter (Signed)
Patient wore monitor and mailed her results

## 2024-02-19 ENCOUNTER — Other Ambulatory Visit: Payer: Self-pay | Admitting: Nurse Practitioner

## 2024-02-19 ENCOUNTER — Ambulatory Visit (INDEPENDENT_AMBULATORY_CARE_PROVIDER_SITE_OTHER): Payer: Self-pay | Admitting: Nurse Practitioner

## 2024-02-19 VITALS — BP 115/80 | HR 84 | Temp 97.4°F | Wt 220.0 lb

## 2024-02-19 DIAGNOSIS — F339 Major depressive disorder, recurrent, unspecified: Secondary | ICD-10-CM | POA: Insufficient documentation

## 2024-02-19 DIAGNOSIS — E66811 Obesity, class 1: Secondary | ICD-10-CM

## 2024-02-19 DIAGNOSIS — R7303 Prediabetes: Secondary | ICD-10-CM | POA: Insufficient documentation

## 2024-02-19 DIAGNOSIS — F419 Anxiety disorder, unspecified: Secondary | ICD-10-CM | POA: Diagnosis not present

## 2024-02-19 DIAGNOSIS — Z124 Encounter for screening for malignant neoplasm of cervix: Secondary | ICD-10-CM

## 2024-02-19 DIAGNOSIS — F32A Depression, unspecified: Secondary | ICD-10-CM | POA: Insufficient documentation

## 2024-02-19 DIAGNOSIS — Z1231 Encounter for screening mammogram for malignant neoplasm of breast: Secondary | ICD-10-CM

## 2024-02-19 DIAGNOSIS — I10 Essential (primary) hypertension: Secondary | ICD-10-CM

## 2024-02-19 MED ORDER — CITALOPRAM HYDROBROMIDE 20 MG PO TABS: 20.0000 mg | ORAL_TABLET | Freq: Every day | ORAL | 1 refills | Status: DC

## 2024-02-19 MED ORDER — WEGOVY 0.25 MG/0.5ML ~~LOC~~ SOAJ: 0.2500 mg | SUBCUTANEOUS | 0 refills | Status: DC

## 2024-02-19 MED ORDER — OLMESARTAN MEDOXOMIL-HCTZ 40-25 MG PO TABS: 1.0000 | ORAL_TABLET | Freq: Every day | ORAL | 3 refills | Status: DC
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
## 2024-02-19 NOTE — Assessment & Plan Note (Signed)
 A1c 5.9 today Patient counseled on low-carb diet.  Lose weight

## 2024-02-19 NOTE — Assessment & Plan Note (Signed)
    02/19/2024    1:29 PM 08/04/2015   12:42 PM  Depression screen PHQ 2/9  Decreased Interest 3 0  Down, Depressed, Hopeless 3 0  PHQ - 2 Score 6 0  Altered sleeping 3   Tired, decreased energy 3   Change in appetite 2   Feeling bad or failure about yourself  1   Trouble concentrating 1   Moving slowly or fidgety/restless 0   Suicidal thoughts 1   PHQ-9 Score 17   Difficult doing work/chores Very difficult         02/19/2024    1:42 PM  GAD 7 : Generalized Anxiety Score  Nervous, Anxious, on Edge 2  Control/stop worrying 3  Worry too much - different things 3  Trouble relaxing 3  Restless 3  Easily annoyed or irritable 1  Afraid - awful might happen 2  Total GAD 7 Score 17  Anxiety Difficulty Somewhat difficult  Restart Celexa  20 mg daily Patient denies SI, HI Encouraged to seek care at the [facility] behavioral health care center as needed for crisis or call 911 Follow-up in 4 weeks

## 2024-02-19 NOTE — Assessment & Plan Note (Addendum)
 Benicar  40-25 mg 1 tablet daily refilled, Bystolic  not refilled due to normal blood pressure Checking CMP Blood pressure monitor ordered  DASH diet and commitment to daily physical activity for a minimum of 30 minutes discussed and encouraged, as a part of hypertension management. The importance of attaining a healthy weight is also discussed. Follow-up in 4 weeks     02/19/2024    1:29 PM 09/10/2023    2:32 AM 09/10/2023    2:31 AM 07/06/2023    9:50 AM 07/06/2023    9:13 AM 06/28/2022   10:05 AM 01/03/2022   10:28 AM  BP/Weight  Systolic BP 115  829 138 142 114 112  Diastolic BP 80  91 92 90 78 80  Wt. (Lbs) 220 220   216 222 230.9  BMI 32.49 kg/m2 32.49 kg/m2   31.9 kg/m2 32.78 kg/m2 34.1 kg/m2

## 2024-02-19 NOTE — Patient Instructions (Signed)
 1. Obesity (BMI 30.0-34.9) (Primary)  - Semaglutide-Weight Management (WEGOVY) 0.25 MG/0.5ML SOAJ; Inject 0.25 mg into the skin once a week.  Dispense: 2 mL; Refill: 0  2. Anxiety and depression  - citalopram  (CELEXA ) 20 MG tablet; Take 1 tablet (20 mg total) by mouth daily.  Dispense: 60 tablet; Refill: 1  3. Prediabetes  - POCT glycosylated hemoglobin (Hb A1C)  4. Screening mammogram for breast cancer  - MM 3D SCREENING MAMMOGRAM BILATERAL BREAST; Future Please call [phone]   to schedule your mammogram.  [facility]. [address] [place], [place] [address]. [place] .    5. Screening for cervical cancer  - Ambulatory referral to Gynecology  6. Primary hypertension  - CMP14+EGFR

## 2024-02-19 NOTE — Assessment & Plan Note (Signed)
 Wt Readings from Last 3 Encounters:  02/19/24 220 lb (99.8 kg)  09/10/23 220 lb (99.8 kg)  07/06/23 216 lb (98 kg)   Body mass index is 32.49 kg/m.  Patient counseled on low-carb diet Encouraged to engage in regular moderate to vigorous exercises at least 150 minutes weekly as tolerated Benefits of healthy weights discussed Patient denies personal or family history of MTC or MEN 2.  They denied personal history of pancreatitis.  Start Wegovy 0.25 mg once weekly injection  Patient encouraged to avoid fatty fried foods eat smaller portions of meal to help decrease nausea.  Encouraged to report abdominal pain, nausea, vomiting.  Follow-up in 4 weeks

## 2024-02-20 ENCOUNTER — Other Ambulatory Visit (HOSPITAL_BASED_OUTPATIENT_CLINIC_OR_DEPARTMENT_OTHER): Payer: Self-pay

## 2024-02-20 LAB — CMP14+EGFR
ALT: 11 IU/L (ref 0–32)
AST: 12 IU/L (ref 0–40)
Albumin: 4 g/dL (ref 3.9–4.9)
Alkaline Phosphatase: 78 IU/L (ref 44–121)
BUN/Creatinine Ratio: 21 (ref 9–23)
BUN: 15 mg/dL (ref 6–24)
Bilirubin Total: 0.3 mg/dL (ref 0.0–1.2)
CO2: 24 mmol/L (ref 20–29)
Calcium: 9.5 mg/dL (ref 8.7–10.2)
Chloride: 105 mmol/L (ref 96–106)
Creatinine, Ser: 0.73 mg/dL (ref 0.57–1.00)
Globulin, Total: 2.5 g/dL (ref 1.5–4.5)
Glucose: 83 mg/dL (ref 70–99)
Potassium: 4.4 mmol/L (ref 3.5–5.2)
Sodium: 141 mmol/L (ref 134–144)
Total Protein: 6.5 g/dL (ref 6.0–8.5)
eGFR: 105 mL/min/{1.73_m2} (ref >59)

## 2024-02-20 LAB — POCT GLYCOSYLATED HEMOGLOBIN (HGB A1C): Hemoglobin A1C: 5.9 % — AB (ref 4.0–5.6)

## 2024-02-20 LAB — MICROALBUMIN / CREATININE URINE RATIO
Creatinine, Urine: 194 mg/dL
Microalb/Creat Ratio: 2 mg/g{creat} (ref 0–29)
Microalbumin, Urine: 3.3 ug/mL

## 2024-02-21 ENCOUNTER — Other Ambulatory Visit: Payer: Self-pay

## 2024-03-26 ENCOUNTER — Encounter: Payer: Self-pay | Admitting: Nurse Practitioner

## 2024-03-26 ENCOUNTER — Ambulatory Visit (INDEPENDENT_AMBULATORY_CARE_PROVIDER_SITE_OTHER): Admitting: Nurse Practitioner

## 2024-03-26 VITALS — BP 106/77 | HR 66 | Temp 97.4°F | Wt 207.0 lb

## 2024-03-26 DIAGNOSIS — I10 Essential (primary) hypertension: Secondary | ICD-10-CM | POA: Diagnosis not present

## 2024-03-26 DIAGNOSIS — Z0000 Encounter for general adult medical examination without abnormal findings: Secondary | ICD-10-CM | POA: Insufficient documentation

## 2024-03-26 DIAGNOSIS — E66811 Obesity, class 1: Secondary | ICD-10-CM

## 2024-03-26 DIAGNOSIS — F32A Depression, unspecified: Secondary | ICD-10-CM

## 2024-03-26 DIAGNOSIS — F419 Anxiety disorder, unspecified: Secondary | ICD-10-CM

## 2024-03-26 MED ORDER — CITALOPRAM HYDROBROMIDE 40 MG PO TABS: 40.0000 mg | ORAL_TABLET | Freq: Every day | ORAL | 1 refills | Status: DC

## 2024-03-26 MED ORDER — WEGOVY 0.5 MG/0.5ML ~~LOC~~ SOAJ: 0.5000 mg | SUBCUTANEOUS | 0 refills | Status: DC

## 2024-03-26 NOTE — Assessment & Plan Note (Addendum)
 Annual exam as documented.  Counseling done include healthy lifestyle involving committing to 150 minutes of exercise per week, heart healthy diet, and attaining healthy weight. The importance of adequate sleep also discussed.  Regular use of seat belt and home safety were also discussed . Changes in health habits are decided on by patient with goals and time frames set for achieving them. Immunization and cancer screening  needs are specifically addressed at this visit.    Declined a breast exam today, she was referred to the gynecologist for cervical cancer screening.  Encouraged to get her mammogram completed.  Up-to-date with Tdap vaccine, should have received the vaccine 9 years ago with her last pregnancy

## 2024-03-26 NOTE — Assessment & Plan Note (Addendum)
 Wt Readings from Last 3 Encounters:  03/26/24 207 lb (93.9 kg)  02/19/24 220 lb (99.8 kg)  09/10/23 220 lb (99.8 kg)   Body mass index is 30.57 kg/m.  She has lost 13 pounds since her last visit, patient congratulated on her efforts at losing weight She denies any adverse reactions to Wegovy  Start Wegovy  0.5 mg once weekly Patient counseled on low-carb high-protein diet, and encouraged to engage  in regular moderate to vigorous exercises at least 150 minutes weekly as tolerated Report abdominal pain, nausea, vomiting Follow-up in 2 months

## 2024-03-26 NOTE — Assessment & Plan Note (Addendum)
    03/26/2024    3:33 PM 02/19/2024    1:29 PM 08/04/2015   12:42 PM  Depression screen PHQ 2/9  Decreased Interest 0 3 0  Down, Depressed, Hopeless 0 3 0  PHQ - 2 Score 0 6 0  Altered sleeping 3 3   Tired, decreased energy 3 3   Change in appetite 2 2   Feeling bad or failure about yourself  0 1   Trouble concentrating 0 1   Moving slowly or fidgety/restless 0 0   Suicidal thoughts 0 1   PHQ-9 Score 8 17   Difficult doing work/chores Very difficult Very difficult        02/19/2024    1:42 PM  GAD 7 : Generalized Anxiety Score  Nervous, Anxious, on Edge 2  Control/stop worrying 3  Worry too much - different things 3  Trouble relaxing 3  Restless 3  Easily annoyed or irritable 1  Afraid - awful might happen 2  Total GAD 7 Score 17  Anxiety Difficulty Somewhat difficult   Feels much better Currently denies SI, HI Increase Celexa  to 40 mg daily, she is interested in counseling referral placed

## 2024-03-26 NOTE — Progress Notes (Signed)
 Complete physical exam  Patient: [name] Guerra   DOB: 1981/10/01   43 y.o. Female  MRN: [id number]  Subjective:     Chief Complaint  Patient presents with   Annual Exam    Fasting for 6 hours    [name] Guerra is a 43 y.o. female  has a past medical history of Allergy, Anxiety (07/06/2023), Depression, Diverticulitis, Dysrhythmia, Gestational diabetes, Heart murmur, Bell's palsy, Hypertension, Kidney stone, Lymphadenitis, Obesity (BMI 30.0-34.9) (06/28/2022), and SVD (spontaneous vaginal delivery) (05/24/2012). who presents today for a complete physical exam. She reports consuming a low carb diet.  Does walking exercises 3 days a week She generally feels well. She reports sleeping poorly due to working 2 jobs and taking care of her children     Most recent fall risk assessment:    08/04/2015   12:42 PM  Fall Risk   Falls in the past year? No     Most recent depression screenings:    03/26/2024    3:33 PM 02/19/2024    1:29 PM  PHQ 2/9 Scores  PHQ - 2 Score 0 6  PHQ- 9 Score 8 17        Patient Care Team: Lezlie Ritchey R, FNP as PCP - General (Nurse Practitioner) Maudine Sos, MD as PCP - Cardiology (Cardiology)   Outpatient Medications Prior to Visit  Medication Sig   nebivolol  (BYSTOLIC ) 5 MG tablet Take 1 tablet (5 mg total) by mouth daily.   olmesartan -hydrochlorothiazide  (BENICAR  HCT) 40-25 MG tablet Take 1 tablet by mouth daily.   [DISCONTINUED] citalopram  (CELEXA ) 20 MG tablet Take 1 tablet (20 mg total) by mouth daily.   [DISCONTINUED] Semaglutide -Weight Management (WEGOVY ) 0.25 MG/0.5ML SOAJ Inject 0.25 mg into the skin once a week.   methocarbamol  (ROBAXIN ) 500 MG tablet Take 1 tablet (500 mg total) by mouth every 8 (eight) hours as needed for muscle spasms. (Patient not taking: Reported on 03/26/2024)   naproxen  (NAPROSYN ) 500 MG tablet Take 1 tablet (500 mg total) by mouth 2 (two) times daily. (Patient not taking: Reported on 03/26/2024)   No  facility-administered medications prior to visit.    Review of Systems  Constitutional:  Negative for appetite change, chills, fatigue and fever.  HENT:  Negative for congestion, postnasal drip, rhinorrhea and sneezing.   Eyes:  Negative for pain, discharge and itching.  Respiratory:  Negative for cough, shortness of breath and wheezing.   Cardiovascular:  Negative for chest pain, palpitations and leg swelling.  Gastrointestinal:  Negative for abdominal pain, constipation, nausea and vomiting.  Endocrine: Negative for cold intolerance, heat intolerance and polydipsia.  Genitourinary:  Negative for difficulty urinating, dysuria, flank pain and frequency.  Musculoskeletal:  Negative for arthralgias, back pain, joint swelling and myalgias.  Skin:  Negative for color change, pallor, rash and wound.  Allergic/Immunologic: Negative for food allergies and immunocompromised state.  Neurological:  Negative for dizziness, facial asymmetry, weakness, numbness and headaches.  Psychiatric/Behavioral:  Negative for behavioral problems, confusion, self-injury and suicidal ideas.        Objective:     BP 106/77   Pulse 66   Temp (!) 97.4 F (36.3 C)   Wt 207 lb (93.9 kg)   SpO2 100%   BMI 30.57 kg/m    Physical Exam Vitals and nursing note reviewed.  Constitutional:      General: She is not in acute distress.    Appearance: Normal appearance. She is obese. She is not ill-appearing, toxic-appearing or diaphoretic.  HENT:  Right Ear: Tympanic membrane, ear canal and external ear normal. There is no impacted cerumen.     Left Ear: Tympanic membrane, ear canal and external ear normal. There is no impacted cerumen.     Nose: Nose normal. No congestion or rhinorrhea.     Mouth/Throat:     Mouth: Mucous membranes are moist.     Pharynx: Oropharynx is clear. No oropharyngeal exudate or posterior oropharyngeal erythema.  Eyes:     General: No scleral icterus.       Right eye: No discharge.         Left eye: No discharge.     Extraocular Movements: Extraocular movements intact.     Conjunctiva/sclera: Conjunctivae normal.  Neck:     Vascular: No carotid bruit.  Cardiovascular:     Rate and Rhythm: Normal rate and regular rhythm.     Pulses: Normal pulses.     Heart sounds: Normal heart sounds. No murmur heard.    No friction rub. No gallop.  Pulmonary:     Effort: Pulmonary effort is normal. No respiratory distress.     Breath sounds: Normal breath sounds. No stridor. No wheezing, rhonchi or rales.  Chest:     Chest wall: No tenderness.  Abdominal:     General: Bowel sounds are normal. There is no distension.     Palpations: Abdomen is soft. There is no mass.     Tenderness: There is no abdominal tenderness. There is no right CVA tenderness, left CVA tenderness, guarding or rebound.     Hernia: No hernia is present.  Musculoskeletal:        General: No swelling, tenderness, deformity or signs of injury.     Cervical back: Normal range of motion and neck supple. No rigidity or tenderness.     Right lower leg: No edema.     Left lower leg: No edema.  Lymphadenopathy:     Cervical: No cervical adenopathy.  Skin:    General: Skin is warm and dry.     Capillary Refill: Capillary refill takes less than 2 seconds.     Coloration: Skin is not jaundiced or pale.     Findings: No bruising, erythema, lesion or rash.  Neurological:     Mental Status: She is alert and oriented to person, place, and time.     Cranial Nerves: No cranial nerve deficit.     Motor: No weakness.     Gait: Gait normal.  Psychiatric:        Mood and Affect: Mood normal.        Behavior: Behavior normal.        Thought Content: Thought content normal.        Judgment: Judgment normal.     No results found for any visits on 03/26/24.     Assessment & Plan:    Routine Health Maintenance and Physical Exam  Immunization History  Administered Date(s) Administered   PFIZER Comirnaty(Gray  Top)Covid-19 Tri-Sucrose Vaccine 12/16/2020, 01/06/2021    Health Maintenance  Topic Date Due   Hepatitis C Screening  Never done   DTaP/Tdap/Td (1 - Tdap) Never done   Cervical Cancer Screening (HPV/Pap Cotest)  Never done   COVID-19 Vaccine (3 - 2024-25 season) 06/25/2023   INFLUENZA VACCINE  05/24/2024   HEMOGLOBIN A1C  08/20/2024   Diabetic kidney evaluation - eGFR measurement  02/18/2025   Diabetic kidney evaluation - Urine ACR  02/18/2025   HIV Screening  Completed   HPV VACCINES  Aged Out   Meningococcal B Vaccine  Aged Out    Discussed health benefits of physical activity, and encouraged her to engage in regular exercise appropriate for her age and condition.  Problem List Items Addressed This Visit       Cardiovascular and Mediastinum   HTN (hypertension)   Controlled on Benicar  40-25 mg 1 tablet daily, Nebivolol  5 mg daily Continue current medications DASH diet and commitment to daily physical activity for a minimum of 30 minutes discussed and encouraged, as a part of hypertension management. The importance of attaining a healthy weight is also discussed.     03/26/2024    3:31 PM 02/19/2024    1:29 PM 09/10/2023    2:32 AM 09/10/2023    2:31 AM 07/06/2023    9:50 AM 07/06/2023    9:13 AM 06/28/2022   10:05 AM  BP/Weight  Systolic BP 106 115  135 138 142 114  Diastolic BP 77 80  91 92 90 78  Wt. (Lbs) 207 220 220   216 222  BMI 30.57 kg/m2 32.49 kg/m2 32.49 kg/m2   31.9 kg/m2 32.78 kg/m2             Other   Obesity (BMI 30.0-34.9)   Wt Readings from Last 3 Encounters:  03/26/24 207 lb (93.9 kg)  02/19/24 220 lb (99.8 kg)  09/10/23 220 lb (99.8 kg)   Body mass index is 30.57 kg/m.  She has lost 13 pounds since her last visit, patient congratulated on her efforts at losing weight She denies any adverse reactions to Wegovy  Start Wegovy  0.5 mg once weekly Patient counseled on low-carb high-protein diet, and encouraged to engage  in regular moderate to  vigorous exercises at least 150 minutes weekly as tolerated Report abdominal pain, nausea, vomiting Follow-up in 2 months      Relevant Medications   Semaglutide -Weight Management (WEGOVY ) 0.5 MG/0.5ML SOAJ   Anxiety and depression      03/26/2024    3:33 PM 02/19/2024    1:29 PM 08/04/2015   12:42 PM  Depression screen PHQ 2/9  Decreased Interest 0 3 0  Down, Depressed, Hopeless 0 3 0  PHQ - 2 Score 0 6 0  Altered sleeping 3 3   Tired, decreased energy 3 3   Change in appetite 2 2   Feeling bad or failure about yourself  0 1   Trouble concentrating 0 1   Moving slowly or fidgety/restless 0 0   Suicidal thoughts 0 1   PHQ-9 Score 8 17   Difficult doing work/chores Very difficult Very difficult        02/19/2024    1:42 PM  GAD 7 : Generalized Anxiety Score  Nervous, Anxious, on Edge 2  Control/stop worrying 3  Worry too much - different things 3  Trouble relaxing 3  Restless 3  Easily annoyed or irritable 1  Afraid - awful might happen 2  Total GAD 7 Score 17  Anxiety Difficulty Somewhat difficult   Feels much better Currently denies SI, HI Increase Celexa  to 40 mg daily, she is interested in counseling referral placed          Relevant Medications   citalopram  (CELEXA ) 40 MG tablet   Other Relevant Orders   AMB Referral VBCI Care Management   Annual physical exam - Primary   Annual exam as documented.  Counseling done include healthy lifestyle involving committing to 150 minutes of exercise per week, heart healthy diet, and attaining healthy weight.  The importance of adequate sleep also discussed.  Regular use of seat belt and home safety were also discussed . Changes in health habits are decided on by patient with goals and time frames set for achieving them. Immunization and cancer screening  needs are specifically addressed at this visit.    Declined a breast exam today, she was referred to the gynecologist for cervical cancer screening.  Encouraged to get  her mammogram completed.  Up-to-date with Tdap vaccine, should have received the vaccine 9 years ago with her last pregnancy      Relevant Orders   Lipid panel   Return in about 2 months (around 05/26/2024), or obesity, for FASTING LABS THIS WEEK.     Chantele Corado R Hanny Elsberry, FNP

## 2024-03-26 NOTE — Patient Instructions (Addendum)
 1. Obesity (BMI 30.0-34.9) (Primary)  - Semaglutide -Weight Management (WEGOVY ) 0.5 MG/0.5ML SOAJ; Inject 0.5 mg into the skin once a week.  Dispense: 2 mL; Refill: 0  2. Anxiety and depression  - AMB Referral VBCI Care Management - citalopram  (CELEXA ) 40 MG tablet; Take 1 tablet (40 mg total) by mouth daily.  Dispense: 90 tablet; Refill: 1  3. Annual physical exam  - Lipid panel; Future    It is important that you exercise regularly at least 30 minutes 5 times a week as tolerated  Think about what you will eat, plan ahead. Choose " clean, green, fresh or frozen" over canned, processed or packaged foods which are more sugary, salty and fatty. 70 to 75% of food eaten should be vegetables and fruit. Three meals at set times with snacks allowed between meals, but they must be fruit or vegetables. Aim to eat over a 12 hour period , example 7 am to 7 pm, and STOP after  your last meal of the day. Drink water,generally about 64 ounces per day, no other drink is as healthy. Fruit juice is best enjoyed in a healthy way, by EATING the fruit.  Thanks for choosing [facility] we consider it a privelige to serve you.

## 2024-03-26 NOTE — Assessment & Plan Note (Signed)
 Controlled on Benicar  40-25 mg 1 tablet daily, Nebivolol  5 mg daily Continue current medications DASH diet and commitment to daily physical activity for a minimum of 30 minutes discussed and encouraged, as a part of hypertension management. The importance of attaining a healthy weight is also discussed.     03/26/2024    3:31 PM 02/19/2024    1:29 PM 09/10/2023    2:32 AM 09/10/2023    2:31 AM 07/06/2023    9:50 AM 07/06/2023    9:13 AM 06/28/2022   10:05 AM  BP/Weight  Systolic BP 106 115  135 138 142 114  Diastolic BP 77 80  91 92 90 78  Wt. (Lbs) 207 220 220   216 222  BMI 30.57 kg/m2 32.49 kg/m2 32.49 kg/m2   31.9 kg/m2 32.78 kg/m2

## 2024-04-05 ENCOUNTER — Telehealth: Payer: Self-pay | Admitting: *Deleted

## 2024-04-05 NOTE — Progress Notes (Signed)
 Complex Care Management Note  Care Guide Note 04/05/2024 Name: [name] Guerra MRN: [id number] DOB: 17-Apr-1981  [name] Guerra is a 43 y.o. year old female who sees Paseda, Folashade R, FNP for primary care. I reached out to [employer] by phone today to offer complex care management services.  [name] Guerra was given information about Complex Care Management services today including:   The Complex Care Management services include support from the care team which includes your Nurse Care Manager, Clinical Social Worker, or Pharmacist.  The Complex Care Management team is here to help remove barriers to the health concerns and goals most important to you. Complex Care Management services are voluntary, and the patient may decline or stop services at any time by request to their care team member.   Complex Care Management Consent Status: Patient agreed to services and verbal consent obtained.   Follow up plan:  Telephone appointment with complex care management team member scheduled for:  7/1  Encounter Outcome:  Patient Scheduled  Barnie Bora  [facility]  [facility], [facility] Guide  Direct Dial: [phone]  Fax [phone]

## 2024-04-22 ENCOUNTER — Other Ambulatory Visit: Payer: Self-pay | Admitting: Nurse Practitioner

## 2024-04-22 DIAGNOSIS — E66811 Obesity, class 1: Secondary | ICD-10-CM

## 2024-04-22 MED ORDER — WEGOVY 0.5 MG/0.5ML ~~LOC~~ SOAJ: 0.5000 mg | SUBCUTANEOUS | 2 refills | Status: DC

## 2024-04-23 ENCOUNTER — Other Ambulatory Visit: Payer: Self-pay | Admitting: Licensed Clinical Social Worker

## 2024-04-30 NOTE — Patient Instructions (Signed)
 Visit Information  [name] Guerra was given information about Medicaid Managed Care team care coordination services as a part of their [employer] Medicaid benefit. [name] Guerra verbally consentedto engagement with the [facility] Managed Care team.   If you are experiencing a medical emergency, please call 911 or report to your local emergency department or urgent care.   If you have a non-emergency medical problem during routine business hours, please contact your provider's office and ask to speak with a nurse.   For questions related to your [facility] [facility] health plan, please call: [phone]  OR visit the member homepage at: [email or web address]  If you would like to schedule transportation through your [facility] [facility] plan, please call the following number at least 2 days in advance of your appointment: [phone]  If you are experiencing a behavioral health crisis, call the [employer] [address]  Behavioral Health Crisis Line at [phone] [phone]). The line is available 24 hours a day, seven days a week.  If you would like help to quit smoking, call 1-800-QUIT-NOW (671-116-0975) OR Espaol: 1-855-Djelo-Ya (8-144-664-6430) o para ms informacin haga clic aqu or Text READY to 799-599 to register via text   [name] Guerra - following are the goals we discussed in your visit today:    Goals Addressed             This Visit's Progress    LCSW VBCI Social Work Care Plan   On track    Problems:   Disease Management support and education needs related to Depression: anxiety  CSW Clinical Goal(s):   Over the next 90 days the Patient will attend all scheduled medical appointments as evidenced by patient report and care team review of appointment completion in electronic MEDICAL RECORD NUMBER  demonstrate a reduction in symptoms related to Depression: anxiety .  Interventions:  Mental  Health:  Evaluation of current treatment plan related to Depression: anxiety Active listening / Reflection utilized Caregiver stress acknowledged :Patient is the primary caregiver for children, who reside with her Financial risk analyst / information provided Emotional Support Provided Mindfulness or Relaxation training provided Provided general psycho-education for mental health needs Solution-Focued Strategies employed:  Patient Goals/Self-Care Activities:  Continue taking your medication as prescribed.   Increase coping skills, healthy habits, and stress reduction  Plan:   Telephone follow up appointment with care management team member scheduled for:  2-4 weeks        Please see education materials related to topics discussed provided by MyChart link.  Patient verbalizes understanding of instructions and care plan provided today and agrees to view in MyChart. Active MyChart status and patient understanding of how to access instructions and care plan via MyChart confirmed with patient.     Licensed Clinical Social Worker will 7/29 at 3:30 PM  Rolin Kerns, LCSW [place]  [facility], [facility] Clinical Social Worker Direct Dial: [phone]  Fax: [phone] Website: [email or web address] 7:17 AM   Following is a copy of your plan of care:  There are no care plans that you recently modified to display for this patient.

## 2024-04-30 NOTE — Patient Outreach (Signed)
 Complex Care Management   Visit Note  04/23/2024  Name:  [name] Guerra MRN: [id number] DOB: 08/24/1981  Situation: Referral received for Complex Care Management related to Stress I obtained verbal consent from Patient.  Visit completed with pt  on the phone  Background:   Past Medical History:  Diagnosis Date   Allergy    Anxiety 07/06/2023   Depression    POST PARTUM   Diverticulitis    Dysrhythmia    Gestational diabetes    diet controlled   Heart murmur    Hx of Bell's palsy    as newborn   Hypertension    Kidney stone    Lymphadenitis    LEFT LOWER LEG   Obesity (BMI 30.0-34.9) 06/28/2022   SVD (spontaneous vaginal delivery) 05/24/2012    Assessment: Patient Reported Symptoms:  Cognitive Cognitive Status: No symptoms reported, Normal speech and language skills, Alert and oriented to person, place, and time Cognitive/Intellectual Conditions Management [RPT]: None reported or documented in medical history or problem list   Health Maintenance Behaviors: Annual physical exam, Spiritual practice(s)  Neurological Neurological Review of Symptoms: No symptoms reported    HEENT HEENT Symptoms Reported: No symptoms reported      Cardiovascular Cardiovascular Symptoms Reported: No symptoms reported Does patient have uncontrolled Hypertension?: No    Respiratory Respiratory Symptoms Reported: No symptoms reported    Endocrine Endocrine Symptoms Reported: No symptoms reported Is patient diabetic?: No    Gastrointestinal Gastrointestinal Symptoms Reported: No symptoms reported      Genitourinary Genitourinary Symptoms Reported: No symptoms reported    Integumentary Integumentary Symptoms Reported: No symptoms reported    Musculoskeletal Musculoskelatal Symptoms Reviewed: No symptoms reported   Falls in the past year?: No Number of falls in past year: 1 or less Was there an injury with Fall?: No Fall Risk Category Calculator: 0 Patient Fall Risk Level: Low Fall  Risk    Psychosocial Psychosocial Symptoms Reported: Report of significant loss, deaths, abandonment, traumatic incidents Additional Psychological Details: Patient reports hx of extensive trauma and current caregiver stress. Pt identified protective factors and denies SI/HI. Practices gratitude thinking to promote positive mood Behavioral Management Strategies: Adequate rest, Coping strategies, Support system Major Change/Loss/Stressor/Fears (CP): School or job, Separation or divorce, Traumatic event Techniques to [employer] with Loss/Stress/Change: Diversional activities, Spiritual practice(s) Quality of Family Relationships: involved Do you feel physically threatened by others?: No      03/26/2024    3:33 PM  Depression screen PHQ 2/9  Decreased Interest 0  Down, Depressed, Hopeless 0  PHQ - 2 Score 0  Altered sleeping 3  Tired, decreased energy 3  Change in appetite 2  Feeling bad or failure about yourself  0  Trouble concentrating 0  Moving slowly or fidgety/restless 0  Suicidal thoughts 0  PHQ-9 Score 8  Difficult doing work/chores Very difficult    There were no vitals filed for this visit.  Medications Reviewed Today     Reviewed by Ezzard Rolin BIRCH, LCSW (Social Worker) on 04/23/24 at 1316  Med List Status: <None>   Medication Order Taking? Sig Documenting Provider Last Dose Status Informant  citalopram  (CELEXA ) 40 MG tablet [id number] Yes Take 1 tablet (40 mg total) by mouth daily. Paseda, Folashade R, FNP  Active   methocarbamol  (ROBAXIN ) 500 MG tablet [id number]  Take 1 tablet (500 mg total) by mouth every 8 (eight) hours as needed for muscle spasms.  Patient not taking: Reported on 04/23/2024   Bero, Michael M, MD  Active  naproxen  (NAPROSYN ) 500 MG tablet [id number]  Take 1 tablet (500 mg total) by mouth 2 (two) times daily.  Patient not taking: Reported on 04/23/2024   Bero, Michael M, MD  Active   nebivolol  (BYSTOLIC ) 5 MG tablet [id number] Yes Take 1 tablet (5 mg total) by  mouth daily. Raford Riggs, MD  Active   olmesartan -hydrochlorothiazide  (BENICAR  HCT) 40-25 MG tablet [id number] Yes Take 1 tablet by mouth daily. Paseda, Folashade R, FNP  Active   Semaglutide -Weight Management (WEGOVY ) 0.5 MG/0.5ML SOAJ [id number] Yes Inject 0.5 mg into the skin once a week. Paseda, Folashade R, FNP  Active             Recommendation:   Continue Current Plan of Care  Follow Up Plan:   Telephone follow-up in 1 month  Rolin Kerns, LCSW [facility]  [facility], [facility] Clinical Social Worker Direct Dial: [phone]  Fax: [phone] Website: [email or web address] 7:17 AM

## 2024-05-01 ENCOUNTER — Telehealth: Payer: Self-pay | Admitting: Nurse Practitioner

## 2024-05-01 NOTE — Telephone Encounter (Signed)
 Copied from CRM [id number]. Topic: General - Billing Inquiry >> May 01, 2024 10:11 AM Elle L wrote: Reason for CRM: Chin with [employer] is requesting the patient's diagnostic code for the patient's services on 02/19/2024. Their call back number is [phone] and the reference number is [id number].

## 2024-05-02 NOTE — Telephone Encounter (Signed)
 This also needed more info . Kh

## 2024-05-02 NOTE — Telephone Encounter (Signed)
 No answer . Not sure if a form request was sent. KH

## 2024-05-21 ENCOUNTER — Telehealth: Payer: Self-pay | Admitting: Licensed Clinical Social Worker

## 2024-05-27 ENCOUNTER — Encounter: Payer: Self-pay | Admitting: Nurse Practitioner

## 2024-05-27 ENCOUNTER — Ambulatory Visit (INDEPENDENT_AMBULATORY_CARE_PROVIDER_SITE_OTHER): Admitting: Nurse Practitioner

## 2024-05-27 VITALS — BP 110/75 | HR 74 | Temp 96.8°F | Wt 203.0 lb

## 2024-05-27 DIAGNOSIS — F32A Depression, unspecified: Secondary | ICD-10-CM

## 2024-05-27 DIAGNOSIS — E66811 Obesity, class 1: Secondary | ICD-10-CM | POA: Diagnosis not present

## 2024-05-27 DIAGNOSIS — I10 Essential (primary) hypertension: Secondary | ICD-10-CM

## 2024-05-27 DIAGNOSIS — F419 Anxiety disorder, unspecified: Secondary | ICD-10-CM | POA: Diagnosis not present

## 2024-05-27 MED ORDER — SEMAGLUTIDE-WEIGHT MANAGEMENT 1 MG/0.5ML ~~LOC~~ SOAJ: 1.0000 mg | SUBCUTANEOUS | 0 refills | Status: DC

## 2024-05-27 MED ORDER — SEMAGLUTIDE (1 MG/DOSE) 4 MG/3ML ~~LOC~~ SOPN: 1.0000 mg | PEN_INJECTOR | SUBCUTANEOUS | 0 refills | Status: DC

## 2024-05-27 MED ORDER — SEMAGLUTIDE-WEIGHT MANAGEMENT 1.7 MG/0.75ML ~~LOC~~ SOAJ: 1.7000 mg | SUBCUTANEOUS | 0 refills | Status: DC

## 2024-05-27 MED ORDER — SEMAGLUTIDE (2 MG/DOSE) 8 MG/3ML ~~LOC~~ SOPN: 2.0000 mg | PEN_INJECTOR | SUBCUTANEOUS | 0 refills | Status: DC

## 2024-05-27 NOTE — Assessment & Plan Note (Addendum)
 Wt Readings from Last 3 Encounters:  05/27/24 203 lb (92.1 kg)  03/26/24 207 lb (93.9 kg)  02/19/24 220 lb (99.8 kg)    Body mass index is 29.98 kg/m.  Currently on Wegovy  0.5 mg once weekly She is Monitoring what she eats and does a lot walking.  She denies any adverse reaction to Wegovy  She has lost 4 pounds since her last visit Increase Wegovy  to 1 mg once weekly after 4 weeks increase to 1.7 mg once weekly Patient counseled on low-carb diet, encouraged to engage in regular moderate exercises at least 150 minutes weekly as tolerated .

## 2024-05-27 NOTE — Assessment & Plan Note (Addendum)
    05/27/2024    1:35 PM 02/19/2024    1:42 PM  GAD 7 : Generalized Anxiety Score  Nervous, Anxious, on Edge 2 2  Control/stop worrying 3 3  Worry too much - different things 3 3  Trouble relaxing 3 3  Restless 3 3  Easily annoyed or irritable 0 1  Afraid - awful might happen 0 2  Total GAD 7 Score 14 17  Anxiety Difficulty Not difficult at all Somewhat difficult        05/27/2024    1:33 PM 03/26/2024    3:33 PM 02/19/2024    1:29 PM 08/04/2015   12:42 PM  Depression screen PHQ 2/9  Decreased Interest 0 0 3 0  Down, Depressed, Hopeless 1 0 3 0  PHQ - 2 Score 1 0 6 0  Altered sleeping 2 3 3    Tired, decreased energy 3 3 3    Change in appetite 1 2 2    Feeling bad or failure about yourself  0 0 1   Trouble concentrating 0 0 1   Moving slowly or fidgety/restless 0 0 0   Suicidal thoughts 0 0 1   PHQ-9 Score 7 8 17    Difficult doing work/chores Not difficult at all Very difficult Very difficult   Currently on Celexa  40 mg daily States that she has a lot going on, recently had a grandchild and another 1 is due soon,her children returning to school, she is currently working and will also be going to school, she was referred for counseling stated that they tried calling her to schedule an appointment but she told them to call back She denies SI, HI Continue Celexa  40 mg daily Follow-up with therapist for counseling

## 2024-05-27 NOTE — Patient Instructions (Addendum)
 Call Gynecology office on [phone] to make an appointment.  1. Obesity (BMI 30.0-34.9) (Primary)  - Semaglutide , 1 MG/DOSE, 4 MG/3ML SOPN; Inject 1 mg as directed once a week.  Dispense: 3 mL; Refill: 0 - Semaglutide -Weight Management 1 MG/0.5ML SOAJ; Inject 1 mg into the skin once a week.  Dispense: 2 mL; Refill: 0 - Semaglutide -Weight Management 1.7 MG/0.75ML SOAJ; Inject 1.7 mg into the skin once a week.  Dispense: 3 mL; Refill: 0  2. Anxiety and depression    It is important that you exercise regularly at least 30 minutes 5 times a week as tolerated  Think about what you will eat, plan ahead. Choose  clean, green, fresh or frozen over canned, processed or packaged foods which are more sugary, salty and fatty. 70 to 75% of food eaten should be vegetables and fruit. Three meals at set times with snacks allowed between meals, but they must be fruit or vegetables. Aim to eat over a 12 hour period , example 7 am to 7 pm, and STOP after  your last meal of the day. Drink water,generally about 64 ounces per day, no other drink is as healthy. Fruit juice is best enjoyed in a healthy way, by EATING the fruit.  Thanks for choosing [facility] we consider it a privelige to serve you.
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
## 2024-05-27 NOTE — Assessment & Plan Note (Signed)
 BP Readings from Last 3 Encounters:  05/27/24 110/75  03/26/24 106/77  02/19/24 115/80   Controlled on Benicar  40-25 mg 1 tablet daily, Nebivolol  5 mg daily Continue current medications

## 2024-05-28 ENCOUNTER — Ambulatory Visit: Payer: Self-pay | Admitting: Nurse Practitioner

## 2024-05-28 ENCOUNTER — Other Ambulatory Visit: Payer: Self-pay

## 2024-05-28 LAB — BASIC METABOLIC PANEL WITH GFR
BUN/Creatinine Ratio: 13 (ref 9–23)
BUN: 11 mg/dL (ref 6–24)
CO2: 25 mmol/L (ref 20–29)
Calcium: 9.1 mg/dL (ref 8.7–10.2)
Chloride: 101 mmol/L (ref 96–106)
Creatinine, Ser: 0.87 mg/dL (ref 0.57–1.00)
Glucose: 78 mg/dL (ref 70–99)
Potassium: 3.7 mmol/L (ref 3.5–5.2)
Sodium: 138 mmol/L (ref 134–144)
eGFR: 85 mL/min/1.73 (ref >59)

## 2024-05-29 ENCOUNTER — Encounter

## 2024-06-04 ENCOUNTER — Telehealth: Payer: Self-pay

## 2024-06-04 NOTE — Telephone Encounter (Signed)
 Pharmacy Patient Advocate Encounter   Received notification from CoverMyMeds that prior authorization for WEGOVY  is required/requested.   Insurance verification completed.   The patient is insured through [facility] .   Per test claim: PA required; PA submitted to above mentioned insurance via CoverMyMeds Key/confirmation #/EOC [id number] Status is pending

## 2024-06-18 ENCOUNTER — Other Ambulatory Visit: Payer: Self-pay | Admitting: Licensed Clinical Social Worker

## 2024-06-21 NOTE — Patient Outreach (Signed)
 Complex Care Management   Visit Note  06/18/2024  Name:  [name] Guerra MRN: [id number] DOB: August 15, 1981  Situation: Referral received for Complex Care Management related to Mental/Behavioral Health diagnosis Depression and Anxiety I obtained verbal consent from Patient.  Visit completed with Patient  on the phone  Background:   Past Medical History:  Diagnosis Date   Allergy    Anxiety 07/06/2023   Depression    POST PARTUM   Diverticulitis    Dysrhythmia    Gestational diabetes    diet controlled   Heart murmur    Hx of Bell's palsy    as newborn   Hypertension    Kidney stone    Lymphadenitis    LEFT LOWER LEG   Obesity (BMI 30.0-34.9) 06/28/2022   SVD (spontaneous vaginal delivery) 05/24/2012    Assessment: Patient Reported Symptoms:  Cognitive Cognitive Status: No symptoms reported, Alert and oriented to person, place, and time, Normal speech and language skills Cognitive/Intellectual Conditions Management [RPT]: None reported or documented in medical history or problem list   Health Maintenance Behaviors: Annual physical exam, Spiritual practice(s)  Neurological Neurological Review of Symptoms: Not assessed    HEENT HEENT Symptoms Reported: Not assessed      Cardiovascular Cardiovascular Symptoms Reported: Not assessed    Respiratory Respiratory Symptoms Reported: Not assesed    Endocrine Endocrine Symptoms Reported: Not assessed    Gastrointestinal Gastrointestinal Symptoms Reported: Not assessed      Genitourinary Genitourinary Symptoms Reported: Not assessed    Integumentary Integumentary Symptoms Reported: Not assessed    Musculoskeletal Musculoskelatal Symptoms Reviewed: Not assessed        Psychosocial Psychosocial Symptoms Reported: No symptoms reported Behavioral Management Strategies: Adequate rest, Coping strategies Major Change/Loss/Stressor/Fears (CP): School or job, Separation or divorce, Traumatic event Techniques to [employer] with  Loss/Stress/Change: Diversional activities, Spiritual practice(s)      06/21/2024    PHQ2-9 Depression Screening   Little interest or pleasure in doing things    Feeling down, depressed, or hopeless    PHQ-2 - Total Score    Trouble falling or staying asleep, or sleeping too much    Feeling tired or having little energy    Poor appetite or overeating     Feeling bad about yourself - or that you are a failure or have let yourself or your family down    Trouble concentrating on things, such as reading the newspaper or watching television    Moving or speaking so slowly that other people could have noticed.  Or the opposite - being so fidgety or restless that you have been moving around a lot more than usual    Thoughts that you would be better off dead, or hurting yourself in some way    PHQ2-9 Total Score    If you checked off any problems, how difficult have these problems made it for you to do your work, take care of things at home, or get along with other people    Depression Interventions/Treatment      There were no vitals filed for this visit.  Medications Reviewed Today     Reviewed by Ezzard Rolin BIRCH, LCSW (Social Worker) on 06/21/24 at 1224  Med List Status: <None>   Medication Order Taking? Sig Documenting Provider Last Dose Status Informant  citalopram  (CELEXA ) 40 MG tablet [id number]  Take 1 tablet (40 mg total) by mouth daily. Paseda, Folashade R, FNP  Active   methocarbamol  (ROBAXIN ) 500 MG tablet [id number]  Take 1  tablet (500 mg total) by mouth every 8 (eight) hours as needed for muscle spasms.  Patient not taking: Reported on 05/27/2024   Bero, Michael M, MD  Active   naproxen  (NAPROSYN ) 500 MG tablet [id number]  Take 1 tablet (500 mg total) by mouth 2 (two) times daily.  Patient not taking: Reported on 05/27/2024   Bero, Michael M, MD  Active   nebivolol  (BYSTOLIC ) 5 MG tablet [id number]  Take 1 tablet (5 mg total) by mouth daily. Raford Riggs, MD  Active    olmesartan -hydrochlorothiazide  (BENICAR  HCT) 40-25 MG tablet [id number]  Take 1 tablet by mouth daily. Paseda, Folashade R, FNP  Active   Semaglutide -Weight Management 1 MG/0.5ML SOAJ [id number]  Inject 1 mg into the skin once a week. Paseda, Folashade R, FNP  Active   Semaglutide -Weight Management 1.7 MG/0.75ML SOAJ [id number]  Inject 1.7 mg into the skin once a week. Paseda, Folashade R, FNP  Active             Recommendation:   Continue Current Plan of Care  Follow Up Plan:   Closing From:  Complex Care Management  Rolin Kerns, LCSW [place]  [facility], [facility] Clinical Social Worker Direct Dial: [phone]  Fax: [phone] Website: [email or web address] 12:28 PM

## 2024-06-21 NOTE — Patient Instructions (Signed)
 Visit Information  Ms. [name] Guerra was given information about Medicaid Managed Care team care coordination services as a part of their [employer] Medicaid benefit.   If you would like to schedule transportation through your [employer] [facility] plan, please call the following number at least 2 days in advance of your appointment: [phone]  If you are experiencing a behavioral health crisis, call the [employer] [place]  Behavioral Health Crisis Line at [phone]). The line is available 24 hours a day, seven days a week.   Ms. [name] Guerra - following are the goals we discussed in your visit today:    Goals Addressed             This Visit's Progress    COMPLETED: LCSW VBCI Social Work Care Plan       Problems:   Disease Management support and education needs related to Depression: anxiety  CSW Clinical Goal(s):   Over the next 90 days the Patient will attend all scheduled medical appointments as evidenced by patient report and care team review of appointment completion in electronic MEDICAL RECORD NUMBER  demonstrate a reduction in symptoms related to Depression: anxiety .  Interventions:  Mental Health:  Evaluation of current treatment plan related to Depression: anxiety Active listening / Reflection utilized Caregiver stress acknowledged :Patient is the primary caregiver for children, who reside with her Crisis Resource Education / information provided Emotional Support Provided Mindfulness or Relaxation training provided Provided general psycho-education for mental health needs Solution-Focued Strategies employed: Patient reports she is doing well. Would like to close due to work schedule does not permit ongoing calls during the day.   Patient Goals/Self-Care Activities:  Continue taking your medication as prescribed.   Increase coping skills, healthy habits, and stress reduction  Patient agreed to f/up with PCP if new needs  arise  Plan:   No further follow up required: LCSW will close         Please see education materials related to topics discussed provided by MyChart link.  Patient verbalizes understanding of instructions and care plan provided today and agrees to view in MyChart. Active MyChart status and patient understanding of how to access instructions and care plan via MyChart confirmed with patient.     No further follow up required: Pt agreed to f/up with PCP with any new needs  Rolin Kerns, LCSW [place]  [facility], [facility] Clinical Social Worker Direct Dial: [phone]  Fax: [phone] Website: [email or web address] 12:29 PM   Following is a copy of your plan of care:  There are no care plans that you recently modified to display for this patient.

## 2024-07-03 ENCOUNTER — Ambulatory Visit: Payer: Self-pay | Admitting: Nurse Practitioner

## 2024-07-17 ENCOUNTER — Ambulatory Visit: Payer: Self-pay | Admitting: Nurse Practitioner

## 2024-07-23 ENCOUNTER — Other Ambulatory Visit: Payer: Self-pay | Admitting: Nurse Practitioner

## 2024-07-23 DIAGNOSIS — E66811 Obesity, class 1: Secondary | ICD-10-CM

## 2024-07-24 ENCOUNTER — Other Ambulatory Visit: Payer: Self-pay

## 2024-07-24 NOTE — Telephone Encounter (Signed)
 Was this PA ever finalized? Thank you!

## 2024-07-25 NOTE — Telephone Encounter (Signed)
 Pt was advised and will be in 08/20/24 for an appt. KH

## 2024-07-26 ENCOUNTER — Other Ambulatory Visit: Payer: Self-pay

## 2024-08-01 ENCOUNTER — Other Ambulatory Visit: Payer: Self-pay

## 2024-08-08 ENCOUNTER — Other Ambulatory Visit: Payer: Self-pay

## 2024-08-08 NOTE — Telephone Encounter (Signed)
 Appt made to change medication. Kh

## 2024-08-14 ENCOUNTER — Encounter: Payer: Self-pay | Admitting: Nurse Practitioner

## 2024-08-14 ENCOUNTER — Other Ambulatory Visit (HOSPITAL_COMMUNITY)
Admission: RE | Admit: 2024-08-14 | Discharge: 2024-08-14 | Disposition: A | Source: Ambulatory Visit | Attending: Nurse Practitioner | Admitting: Nurse Practitioner

## 2024-08-14 ENCOUNTER — Ambulatory Visit: Payer: Self-pay | Admitting: Nurse Practitioner

## 2024-08-14 VITALS — BP 111/69 | HR 85 | Wt 193.0 lb

## 2024-08-14 DIAGNOSIS — E66811 Obesity, class 1: Secondary | ICD-10-CM

## 2024-08-14 DIAGNOSIS — Z23 Encounter for immunization: Secondary | ICD-10-CM | POA: Diagnosis not present

## 2024-08-14 DIAGNOSIS — Z136 Encounter for screening for cardiovascular disorders: Secondary | ICD-10-CM

## 2024-08-14 DIAGNOSIS — Z13220 Encounter for screening for lipoid disorders: Secondary | ICD-10-CM

## 2024-08-14 DIAGNOSIS — Z124 Encounter for screening for malignant neoplasm of cervix: Secondary | ICD-10-CM | POA: Insufficient documentation

## 2024-08-14 DIAGNOSIS — I10 Essential (primary) hypertension: Secondary | ICD-10-CM

## 2024-08-14 MED ORDER — CONTRAVE 8-90 MG PO TB12: ORAL_TABLET | ORAL | 0 refills | Status: DC

## 2024-08-14 NOTE — Patient Instructions (Signed)
 1. Need for influenza vaccination (Primary)  - Flu vaccine trivalent PF, 6mos and older(Flulaval,Afluria,Fluarix,Fluzone)  2. Obesity (BMI 30.0-34.9)  - Naltrexone-buPROPion HCl ER (CONTRAVE) 8-90 MG TB12; Start 1 tablet every morning for 7 days, then 1 tablet twice daily for 7 days, then 2 tablets every morning and one every evening  Dispense: 120 tablet; Refill: 0    It is important that you exercise regularly at least 30 minutes 5 times a week as tolerated  Think about what you will eat, plan ahead. Choose  clean, green, fresh or frozen over canned, processed or packaged foods which are more sugary, salty and fatty. 70 to 75% of food eaten should be vegetables and fruit. Three meals at set times with snacks allowed between meals, but they must be fruit or vegetables. Aim to eat over a 12 hour period , example 7 am to 7 pm, and STOP after  your last meal of the day. Drink water,generally about 64 ounces per day, no other drink is as healthy. Fruit juice is best enjoyed in a healthy way, by EATING the fruit.  Thanks for choosing [facility] we consider it a privelige to serve you.

## 2024-08-14 NOTE — Assessment & Plan Note (Signed)
-   Perform Pap smear.

## 2024-08-14 NOTE — Assessment & Plan Note (Addendum)
 Wt Readings from Last 3 Encounters:  08/14/24 193 lb (87.5 kg)  05/27/24 203 lb (92.1 kg)  03/26/24 207 lb (93.9 kg)   Body mass index is 28.5 kg/m.  BMI 28.5, previously obese. Wegovy  effective but discontinued. Considering Contrave. - Prescribe Contrave with titration: one tablet every morning for 7 days, then one tablet twice a day for 7 days, then two tablets every morning and one every evening. - Continue lifestyle modifications including diet and exercise. - Follow up after completing the starter pack to assess response to Contrave.
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
## 2024-08-14 NOTE — Assessment & Plan Note (Signed)
 BP Readings from Last 3 Encounters:  08/14/24 111/69  05/27/24 110/75  03/26/24 106/77   Blood pressure controlled at 111/69 mmHg on Benicar  HCT. No recent palpitations. - Discontinue Nebivolol . - Continue Benicar  HCT 40-25 mg daily. - Maintain heart-healthy lifestyle with low salt, low fat diet and regular physical activity.

## 2024-08-15 LAB — LIPID PANEL
Chol/HDL Ratio: 3 ratio (ref 0.0–4.4)
Cholesterol, Total: 158 mg/dL (ref 100–199)
HDL: 52 mg/dL (ref >39)
LDL Chol Calc (NIH): 92 mg/dL (ref 0–99)
Triglycerides: 73 mg/dL (ref 0–149)
VLDL Cholesterol Cal: 14 mg/dL (ref 5–40)

## 2024-08-16 ENCOUNTER — Ambulatory Visit: Payer: Self-pay | Admitting: Nurse Practitioner

## 2024-08-16 DIAGNOSIS — R87610 Atypical squamous cells of undetermined significance on cytologic smear of cervix (ASC-US): Secondary | ICD-10-CM

## 2024-08-19 ENCOUNTER — Other Ambulatory Visit: Payer: Self-pay | Admitting: Nurse Practitioner

## 2024-08-19 DIAGNOSIS — E66811 Obesity, class 1: Secondary | ICD-10-CM

## 2024-08-19 MED ORDER — CONTRAVE 8-90 MG PO TB12: ORAL_TABLET | ORAL | 0 refills | Status: DC

## 2024-08-20 ENCOUNTER — Ambulatory Visit: Admitting: Nurse Practitioner

## 2024-08-20 NOTE — Telephone Encounter (Signed)
 Spoke to two different [facility] and was advised I would be call back . Kh

## 2024-08-21 LAB — CYTOLOGY - PAP
Comment: NEGATIVE
Comment: NEGATIVE
Comment: NEGATIVE
Diagnosis: UNDETERMINED — AB
HPV 16: NEGATIVE
HPV 18 / 45: NEGATIVE
High risk HPV: POSITIVE — AB

## 2024-08-22 ENCOUNTER — Other Ambulatory Visit: Payer: Self-pay

## 2024-08-23 ENCOUNTER — Other Ambulatory Visit: Payer: Self-pay

## 2024-08-23 ENCOUNTER — Telehealth: Payer: Self-pay

## 2024-08-23 NOTE — Telephone Encounter (Signed)
 Pt was called to advise. No answer. KH

## 2024-08-23 NOTE — Telephone Encounter (Signed)
 Pharmacy Patient Advocate Encounter   Received notification from Physician's Office that prior authorization for CONTRAVE is required/requested.   Insurance verification completed.   The patient is insured through [employer].   Per test claim: PA required; PA submitted to above mentioned insurance via CoverMyMeds Key/confirmation #/EOC [id number] Status is pending

## 2024-08-23 NOTE — Telephone Encounter (Signed)
 Pharmacy Patient Advocate Encounter  Received notification from [facility] [employer] that Prior Authorization for [facility] has been DENIED.  Full denial letter will be uploaded to the media tab. See denial reason below.   PA #/Case ID/Reference #: [id number]    Medicaid does not cover the following service(s) in the [facility] Plan: [id number] CONTRAVE 8/90/MG Tablet ER 12HR  This drug's manufacturer does not participate in the Medicaid Drug Rebate Program and therefore the drug  is not a covered pharmacy benefit.

## 2024-08-26 ENCOUNTER — Telehealth: Payer: Self-pay

## 2024-08-26 NOTE — Telephone Encounter (Signed)
 Copied from CRM #[id number]. Topic: General - Other >> Aug 23, 2024 11:45 AM Joesph NOVAK wrote: Reason for CRM: patient returning call for [facility] [phone]  Will phentermine be a option for the pt. Kh

## 2024-08-27 NOTE — Telephone Encounter (Signed)
 Pt was advised [facility]

## 2024-08-28 ENCOUNTER — Encounter

## 2024-09-13 ENCOUNTER — Ambulatory Visit: Payer: Self-pay | Admitting: Nurse Practitioner

## 2024-09-16 ENCOUNTER — Other Ambulatory Visit (HOSPITAL_COMMUNITY)
Admission: RE | Admit: 2024-09-16 | Discharge: 2024-09-16 | Disposition: A | Discharge status: Home / Self Care | Source: Ambulatory Visit | Attending: Obstetrics and Gynecology | Admitting: Obstetrics and Gynecology

## 2024-09-16 ENCOUNTER — Encounter: Payer: Self-pay | Admitting: Obstetrics and Gynecology

## 2024-09-16 ENCOUNTER — Ambulatory Visit: Admitting: Obstetrics and Gynecology

## 2024-09-16 VITALS — BP 110/68 | HR 79 | Temp 98.8°F | Wt 207.0 lb

## 2024-09-16 DIAGNOSIS — R87610 Atypical squamous cells of undetermined significance on cytologic smear of cervix (ASC-US): Secondary | ICD-10-CM | POA: Insufficient documentation

## 2024-09-16 DIAGNOSIS — Z23 Encounter for immunization: Secondary | ICD-10-CM | POA: Diagnosis not present

## 2024-09-16 DIAGNOSIS — R87810 Cervical high risk human papillomavirus (HPV) DNA test positive: Secondary | ICD-10-CM | POA: Insufficient documentation

## 2024-09-16 DIAGNOSIS — Z01812 Encounter for preprocedural laboratory examination: Secondary | ICD-10-CM

## 2024-09-16 LAB — PREGNANCY, URINE: Preg Test, Ur: NEGATIVE

## 2024-09-16 NOTE — Patient Instructions (Signed)
 It is common to have vaginal bleeding and cramping for up to 72 hours after your biopsy. Please call our office with heavy vaginal bleeding, severe abdominal pain or fever. Avoid intercourse, tampon use, douching and baths for 7 days to decrease the risk of infection.

## 2024-09-16 NOTE — Progress Notes (Signed)
 43 y.o. [id number] female with hx of BTL (2017) here for colposcopy. Divorced. 6 girls, 8-24yo.  No LMP recorded. Patient has had an ablation.   No hx of abnormal PAPs. Last PAP ~5-10yr ago after her youngest daughter was born.  Gardasil: never  Last PAP:    Component Value Date/Time   DIAGPAP (A) 08/14/2024 1125    - Atypical squamous cells of undetermined significance (ASC-US )   HPVHIGH Positive (A) 08/14/2024 1125   ADEQPAP  08/14/2024 1125    Satisfactory for evaluation; transformation zone component PRESENT.   GYN HISTORY: No significant history  OB History  Gravida Para Term Preterm AB Living  8 6 6  2 6   SAB IAB Ectopic Multiple Live Births  2   0 6    # Outcome Date GA Lbr Len/2nd Weight Sex Type Anes PTL Lv  8 Term 09/29/15 [redacted]w[redacted]d 04:28 / 00:03 7 lb 13.8 oz (3.565 kg) F Vag-Spont EPI  LIV  7 Term 05/22/12 [redacted]w[redacted]d 07:41 / 00:08 9 lb 3 oz (4.167 kg) F Vag-Spont EPI  LIV  6 SAB 2012 [redacted]w[redacted]d         5 Term 2010 [redacted]w[redacted]d 04:00 8 lb 2 oz (3.685 kg) F Vag-Spont None  LIV  4 Term 2006 [redacted]w[redacted]d 06:00 8 lb 3 oz (3.714 kg) F Vag-Spont None  LIV  3 SAB 2005 [redacted]w[redacted]d         2 Term 2004 [redacted]w[redacted]d 09:00 7 lb 6 oz (3.345 kg) F Vag-Spont None  LIV  1 Term 2001 [redacted]w[redacted]d 36:00 8 lb 6 oz (3.799 kg) F Vag-Spont None  LIV    Past Medical History:  Diagnosis Date   Allergy    Anxiety 07/06/2023   Depression    POST PARTUM   Diverticulitis    Dysrhythmia    Gestational diabetes    diet controlled   Heart murmur    Hx of Bell's palsy    as newborn   Hypertension    Kidney stone    Lymphadenitis    LEFT LOWER LEG   Obesity (BMI 30.0-34.9) 06/28/2022   SVD (spontaneous vaginal delivery) 05/24/2012    Past Surgical History:  Procedure Laterality Date   BREAST CYST EXCISION     LAPAROSCOPIC TUBAL LIGATION Bilateral 01/15/2016   Procedure: LAPAROSCOPIC TUBAL LIGATION;  Surgeon: Nathanel Bunker, MD;  Location: [facility];  Service: Gynecology;  Laterality: Bilateral;   TRANSTHORACIC ECHOCARDIOGRAM   02/09/2012   EF=55%, Patent foramen ovale is present, Doppler suggest left to right interatrial shunt   WISDOM TOOTH EXTRACTION      Current Outpatient Medications on File Prior to Visit  Medication Sig Dispense Refill   citalopram  (CELEXA ) 40 MG tablet Take 1 tablet (40 mg total) by mouth daily. 90 tablet 1   olmesartan -hydrochlorothiazide  (BENICAR  HCT) 40-25 MG tablet Take 1 tablet by mouth daily. 90 tablet 3   No current facility-administered medications on file prior to visit.    Allergies  Allergen Reactions   Iron Other (See Comments)    Other reaction(s): CONSTIPATION Other reaction(s): CONSTIPATION    Lisinopril Cough      PE Today's Vitals   09/16/24 1359  BP: 110/68  Pulse: 79  Temp: 98.8 F (37.1 C)  TempSrc: Oral  SpO2: 99%  Weight: 207 lb (93.9 kg)   Body mass index is 30.57 kg/m.  Physical Exam Vitals reviewed. Exam conducted with a chaperone present.  Constitutional:      General: She is not in acute distress.  Appearance: Normal appearance.  HENT:     Head: Normocephalic and atraumatic.     Nose: Nose normal.  Eyes:     Extraocular Movements: Extraocular movements intact.     Conjunctiva/sclera: Conjunctivae normal.  Pulmonary:     Effort: Pulmonary effort is normal.  Genitourinary:    General: Normal vulva.     Exam position: Lithotomy position.     Vagina: Normal. No vaginal discharge.     Cervix: Normal. No cervical motion tenderness, discharge or lesion.  Musculoskeletal:        General: Normal range of motion.     Cervical back: Normal range of motion.  Neurological:     General: No focal deficit present.     Mental Status: She is alert.  Psychiatric:        Mood and Affect: Mood normal.        Behavior: Behavior normal.      Colposcopy Procedure Consented for procedure.  Time out performed. Speculum placed in vagina.  Acetic acid 3% was applied to cervix.  Unsatisfactory colposcopy. TZ was completely seen. Hurricaine  anesthetic spray was applied. Biopsies taken No. Location(s) - 7:00, random  Findings: Normal ectocervix, TZ was endocervical and incompletely seen ECC was performed.  Specimens to pathology separately.  Monsel's applied to biopsy areas.   Good hemostasis.  Minimal EBL. No complications.  Tolerated well.     Assessment and Plan:        ASCUS with positive high risk HPV cervical -     Surgical pathology  Pre-procedure lab exam -     Pregnancy, urine  Need for HPV vaccination -     HPV 9-valent vaccine,Recombinat  Abnormal PAP results reviewed. ASCCP guidelines reviewed. Consents signed. Unsatisfactory colposcopy, ECC and biopsy performed. Aftercare instructions provided.  Will call with results. I also recommend completion of Gardasil vaccine up to age 45yo, avoidance of smoking, and use of condoms with new sexual partners to prevent progression of disease.   Vera LULLA Pa, MD

## 2024-09-17 ENCOUNTER — Encounter: Payer: Self-pay | Admitting: Obstetrics and Gynecology

## 2024-09-18 ENCOUNTER — Encounter: Payer: Self-pay | Admitting: Obstetrics and Gynecology

## 2024-09-18 LAB — SURGICAL PATHOLOGY

## 2024-09-23 ENCOUNTER — Ambulatory Visit: Payer: Self-pay | Admitting: Obstetrics and Gynecology

## 2024-09-23 NOTE — Telephone Encounter (Signed)
 Routing to Dr. Dallie to review 09/16/24 colpo results.

## 2024-09-23 NOTE — Telephone Encounter (Signed)
 Reviewed via results.

## 2024-11-07 ENCOUNTER — Ambulatory Visit: Payer: Self-pay

## 2024-11-07 NOTE — Telephone Encounter (Signed)
 Pt states that she has been having L arm intermittent numbness as well as palpitations, pt states that she has also been nauseous. Pt advised to go to the ED.  Copied from CRM [id number]. Topic: Clinical - Red Word Triage >> Nov 07, 2024  8:40 AM Antwanette L wrote: Red Word that prompted transfer to Nurse Triage: Patient reports experiencing numbness in the left arm,  headaches, and episodes where her heart feels like it is beating irregularly, and nausea. She also stated that she missed her appointment on 11/21 and needs to schedule a new appointment.

## 2024-11-18 ENCOUNTER — Ambulatory Visit

## 2024-11-19 ENCOUNTER — Ambulatory Visit (INDEPENDENT_AMBULATORY_CARE_PROVIDER_SITE_OTHER): Admitting: Nurse Practitioner

## 2024-11-19 ENCOUNTER — Ambulatory Visit: Payer: Self-pay | Admitting: Nurse Practitioner

## 2024-11-19 VITALS — BP 119/85 | HR 89 | Wt 225.0 lb

## 2024-11-19 DIAGNOSIS — Z1231 Encounter for screening mammogram for malignant neoplasm of breast: Secondary | ICD-10-CM | POA: Diagnosis not present

## 2024-11-19 DIAGNOSIS — R002 Palpitations: Secondary | ICD-10-CM | POA: Diagnosis not present

## 2024-11-19 DIAGNOSIS — I10 Essential (primary) hypertension: Secondary | ICD-10-CM

## 2024-11-19 DIAGNOSIS — R202 Paresthesia of skin: Secondary | ICD-10-CM

## 2024-11-19 DIAGNOSIS — F419 Anxiety disorder, unspecified: Secondary | ICD-10-CM | POA: Diagnosis not present

## 2024-11-19 DIAGNOSIS — Z1321 Encounter for screening for nutritional disorder: Secondary | ICD-10-CM

## 2024-11-19 DIAGNOSIS — F331 Major depressive disorder, recurrent, moderate: Secondary | ICD-10-CM

## 2024-11-19 DIAGNOSIS — R200 Anesthesia of skin: Secondary | ICD-10-CM | POA: Insufficient documentation

## 2024-11-19 DIAGNOSIS — E66811 Obesity, class 1: Secondary | ICD-10-CM | POA: Diagnosis not present

## 2024-11-19 MED ORDER — OLMESARTAN MEDOXOMIL-HCTZ 40-25 MG PO TABS: 1.0000 | ORAL_TABLET | Freq: Every day | ORAL | 3 refills | Status: AC

## 2024-11-19 MED ORDER — ESCITALOPRAM OXALATE 10 MG PO TABS: 10.0000 mg | ORAL_TABLET | Freq: Every day | ORAL | 1 refills | Status: AC

## 2024-11-19 NOTE — Assessment & Plan Note (Addendum)
 Screening for thyroid dysfunction Consider restarting nebivolol  Previous cardiac monitoring  showed no significant arrhythmias.

## 2024-11-19 NOTE — Patient Instructions (Signed)
 1. Obesity (BMI 30.0-34.9) (Primary)  2. Primary hypertension - CBC - Basic Metabolic Panel - olmesartan -hydrochlorothiazide  (BENICAR  HCT) 40-25 MG tablet; Take 1 tablet by mouth daily.  Dispense: 90 tablet; Refill: 3  3. Moderate episode of recurrent major depressive disorder (HCC) - escitalopram  (LEXAPRO ) 10 MG tablet; Take 1 tablet (10 mg total) by mouth daily.  Dispense: 60 tablet; Refill: 1  4. Anxiety - escitalopram  (LEXAPRO ) 10 MG tablet; Take 1 tablet (10 mg total) by mouth daily.  Dispense: 60 tablet; Refill: 1  5. Encounter for vitamin deficiency screening - VITAMIN D 25 Hydroxy (Vit-D Deficiency, Fractures)    Behavioral Health Resources:    What if I or someone I know is in crisis?   If you are thinking about harming yourself or having thoughts of suicide, or if you know someone who is, seek help right away.   Call your doctor or mental health care provider.   Call 911 or go to a hospital emergency room to get immediate help, or ask a friend or family member to help you do these things; IF YOU ARE IN [place], YOU MAY GO TO WALK-IN URGENT CARE 24/7 at [facility] (see below)   Call the [place]  National Suicide Prevention Lifelines toll-free, 24-hour hotline at 1-800-273-TALK 832-567-4603) or TTY: 1-800-799-4 TTY 437-795-6034) to talk to a trained counselor.   If you are in crisis, make sure you are not left alone.    If someone else is in crisis, make sure he or she is not left alone     24 Hour :    [facility]  [address], [place], [place] [address] [phone] or [phone] WALK-IN URGENT CARE 24/7   [facility] Mobile Crisis: [phone]   [place]  National Suicide Hotline: 8194973629   [facility] [employer] (Domestic Violence, Rape & Victim Assistance)  [phone]   [facility]  [address], [place]  [address]    [phone] or [phone]    [facility] [employer] [facility]: [phone] (8am-4pm) or [phone] (after hours)          [facility], [address], [place], [place]  [phone] Fax: [phone] [email or web address] *Interpreters available *Accepts all insurance and uninsured for Urgent Care needs *Accepts Medicaid and uninsured for outpatient treatment    [facility]   Mon-Fri: 8am-5pm [address], [place], [place] [phone](eynwz); [phone](fax) [email or web address]  *Accepts Medicare   [facility] [place] Earlean [place], Fri: 8am-4pm [address], [place], [place] [address] [phone] (phone); [phone] (fax) [email or web address]  *Accepts Medicare   [facility] Mon-Fri: 9am-5pm  [address], [place], [place] [phone] (phone); [phone]  [email or web address]  *Accepts Medicaid   [facility] [place], 8:30am-12pm/1pm-2:30pm [address] [place], [place] [phone] (phone); [phone] (fax) [email or web address]  *Accepts Medicaid, sliding-scale*Bilingual services available   [facility] Mon-Fri, 8am-7pm [address], [place], [place]  [phone](eynwz); [phone](fax) [email or web address]  *Accepts Medicaid *Bilingual services available   [facility] Mon-Fri: 8am-5pm, Saturday by appointment only [address] [place], [place], [place] [phone] (phone); [phone] (fax) [email or web address]    [facility] [address], [place], [place] [phone] [email or web address]  *Free & reduced services for uninsured and underinsured individuals *Bilingual services for Spanish-speaking clients 21 and under   [facility],  [address], [place], [place] [phone](eynwz);  [phone]) [email or web address]  *Bring your own interpreter at first visit *Accepts Medicare and Medicaid   [facility] Mon-Fri: 9am-5:30pm [address], [place], [place] [phone] (phone), [phone] (fax) After hours crisis line: [phone] [email or web address]  *Accepts Medicare and Medicaid   [facility], 8am-6pm [address] [place], [place]  [phone] (phone); [phone] (fax) [email or web address]  *Subsidized costs available   [facility] Mon-Fri: 8am-4pm [address], [place], [place] [phone](eynwz); [phone](fax) [email or web address]  *Accepts Medicaid   [facility] Same day access hours: Mon-Fri, 8:30-3pm Crisis hours: Mon-Fri, 8am-5pm [address], [place], [place] [phone]   [facility] Same day access hours: Mon-Fri, 8:30-3pm Crisis hours: Mon-Fri, 8am-8pm [address], [place], [place] [phone] (phone); [phone] (fax) [email or web address]  *Accepts Medicaid and Medicare   [facility] [place], [place], Fri: 9am-9pm Tues, Thurs: 9am-6pm [address] [place], [place], [place]  [phone] (phone); [phone] (fax) [email or web address]  *(Accepts Medicare and Medicaid; payment plans available)*Bilingual services available   [facility] [address], [place], [place] [phone] (phone); [phone] (fax) [email or web address]    [facility] [address], [place], [place]  [phone]  [email or web address]  *Bilingual services available   [facility]) Mon-Thurs: 8am-8pm [address], [place], [place] [phone] (phone); [phone] (fax) [email or web address]  *Accepts  Medicaid*Bilingual services available   [facility] [address] [place], [place] [phone] (phone) [email or web address]  *Accepts Medicaid *Bilingual services available   [facility] Mon-Fri, 9am-4:45pm [address], [place], [place] [phone] (phone); [phone] (fax) [email or web address]  *Accepts Medicare   [facility] Mon-Thurs: 8:30-8pm, Fri: 8:30am-7pm [address] [place], [place] (3rd floor) [phone] (phone); [phone] (fax) [email or web address]  *Accepts Medicaid; income-based reduced rates available   [facility] Mon-Fri: 8am-5pm [address], [place], [place] [address] [phone] (phone); [phone] (fax) [email or web address]  *Accepts Medicaid*Bilingual services available     [facility] [place])  [address], [place] [phone] [email or web address]  *Provides direct services to individuals in recovery from mental illness, including support groups, recovery skills classes, and one on one peer support   [employer] on Mental Illness) [place] helpline: [phone]  [facility]MI [place] helpline: [phone] [email or web address]  *A community hub for information relating to local resources and services for the friends and families of individuals living alongside a mental health condition, as well as the individuals themselves. Classes and support groups also provided

## 2024-11-19 NOTE — Assessment & Plan Note (Signed)
 Intermittent numbness and tingling of left arm Denies left arm or left shoulder pain Consider referral to neurology for EMG if symptoms persist Checking CBC

## 2024-11-19 NOTE — Assessment & Plan Note (Addendum)
 Wt Readings from Last 3 Encounters:  11/19/24 225 lb (102.1 kg)  09/16/24 207 lb (93.9 kg)  08/14/24 193 lb (87.5 kg)   Body mass index is 33.23 kg/m.   Weight gain due to stress eating. Previously on wegovy ,, effective for weight loss, but no longer covered by Medicaid. Encouraged lifestyle changes. - Encouraged diet with 70-80% vegetables and protein, less carbohydrates. - Advised replacing juice and soda with water. - Recommended 30 minutes of moderate to vigorous exercise, five days a week, as tolerated.
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
## 2024-11-19 NOTE — Assessment & Plan Note (Addendum)
" °    11/19/2024    2:43 PM 08/14/2024   10:53 AM 05/27/2024    1:33 PM  Depression screen PHQ 2/9  Decreased Interest 0 0 0  Down, Depressed, Hopeless 1 1 1   PHQ - 2 Score 1 1 1   Altered sleeping 3 1 2   Tired, decreased energy 3 1 3   Change in appetite 2 1 1   Feeling bad or failure about yourself  2 0 0  Trouble concentrating 0 0 0  Moving slowly or fidgety/restless 0 0 0  Suicidal thoughts 0 0 0  PHQ-9 Score 11 4  7    Difficult doing work/chores Extremely dIfficult Not difficult at all Not difficult at all     Data saved with a previous flowsheet row definition   Anxiety and depression Experiencing anxiety and depression, exacerbated by stress. Out of Citalopram  for a week?, reports it is ineffective. In therapy but attendance is difficult due to work. Considering medication change. - Provided list of therapy options with flexible hours. - Start Lexapro  10 mg daily Denies SI, HI Follow-up in 6 weeks "

## 2024-11-19 NOTE — Assessment & Plan Note (Signed)
" °  Mammogram not completed due to scheduling conflicts. Needs early morning appointment. - Provided contact information for breast center to schedule early morning mammogram.   "

## 2024-11-19 NOTE — Assessment & Plan Note (Addendum)
" °    11/19/2024    2:38 PM 08/14/2024   10:57 AM 05/27/2024    1:35 PM 02/19/2024    1:42 PM  GAD 7 : Generalized Anxiety Score  Nervous, Anxious, on Edge 2 1  2  2    Control/stop worrying 3 2  3  3    Worry too much - different things 3 0  3  3   Trouble relaxing 1 3  3  3    Restless 0 0  3  3   Easily annoyed or irritable 3 0  0  1   Afraid - awful might happen 0 0  0  2   Total GAD 7 Score 12 6 14 17   Anxiety Difficulty Somewhat difficult Not difficult at all Not difficult at all Somewhat difficult     Data saved with a previous flowsheet row definition  Anxiety and depression Experiencing anxiety and depression, exacerbated by stress. Out of Citalopram  for a week?, reports it is ineffective. In therapy but attendance is difficult due to work. Considering medication change. - Provided list of therapy options with flexible hours. - Start Lexapro  10 mg daily Denies SI, HI Follow-up in 6 weeks  "

## 2024-11-19 NOTE — Assessment & Plan Note (Signed)
 DASH diet and commitment to daily physical activity for a minimum of 30 minutes discussed and encouraged, as a part of hypertension management. The importance of attaining a healthy weight is also discussed. Continue Benicar  HCT 40-25 mg 1 tablet daily     11/19/2024    2:31 PM 09/16/2024    1:59 PM 08/14/2024   10:51 AM 05/27/2024    1:30 PM 03/26/2024    3:31 PM 02/19/2024    1:29 PM 09/10/2023    2:32 AM  BP/Weight  Systolic BP 119 110 111 110 106 115   Diastolic BP 85 68 69 75 77 80   Wt. (Lbs) 225 207 193 203 207 220 220  BMI 33.23 kg/m2 30.57 kg/m2 28.5 kg/m2 29.98 kg/m2 30.57 kg/m2 32.49 kg/m2 32.49 kg/m2

## 2024-11-20 ENCOUNTER — Ambulatory Visit: Payer: Self-pay | Admitting: Nurse Practitioner

## 2024-11-20 DIAGNOSIS — E559 Vitamin D deficiency, unspecified: Secondary | ICD-10-CM

## 2024-11-20 LAB — BASIC METABOLIC PANEL WITH GFR
BUN/Creatinine Ratio: 13 (ref 9–23)
BUN: 9 mg/dL (ref 6–24)
CO2: 28 mmol/L (ref 20–29)
Calcium: 9.8 mg/dL (ref 8.7–10.2)
Chloride: 103 mmol/L (ref 96–106)
Creatinine, Ser: 0.69 mg/dL (ref 0.57–1.00)
Glucose: 78 mg/dL (ref 70–99)
Potassium: 4.3 mmol/L (ref 3.5–5.2)
Sodium: 141 mmol/L (ref 134–144)
eGFR: 110 mL/min/{1.73_m2}

## 2024-11-20 LAB — CBC
Hematocrit: 37.7 % (ref 34.0–46.6)
Hemoglobin: 12.5 g/dL (ref 11.1–15.9)
MCH: 28.9 pg (ref 26.6–33.0)
MCHC: 33.2 g/dL (ref 31.5–35.7)
MCV: 87 fL (ref 79–97)
Platelets: 216 10*3/uL (ref 150–450)
RBC: 4.32 x10E6/uL (ref 3.77–5.28)
RDW: 12.7 % (ref 11.7–15.4)
WBC: 5.5 10*3/uL (ref 3.4–10.8)

## 2024-11-20 LAB — VITAMIN D 25 HYDROXY (VIT D DEFICIENCY, FRACTURES): Vit D, 25-Hydroxy: 16 ng/mL — ABNORMAL LOW (ref 30.0–100.0)

## 2024-11-20 MED ORDER — VITAMIN D3 25 MCG (1000 UT) PO CAPS: 1000.0000 [IU] | ORAL_CAPSULE | Freq: Every day | ORAL | 1 refills | Status: AC

## 2024-11-21 ENCOUNTER — Ambulatory Visit (INDEPENDENT_AMBULATORY_CARE_PROVIDER_SITE_OTHER)

## 2024-11-21 ENCOUNTER — Ambulatory Visit

## 2024-11-21 DIAGNOSIS — Z23 Encounter for immunization: Secondary | ICD-10-CM

## 2024-11-21 LAB — TSH: TSH: 2.24 u[IU]/mL (ref 0.450–4.500)

## 2024-11-21 LAB — SPECIMEN STATUS REPORT

## 2024-11-21 NOTE — Progress Notes (Signed)
 2nd HPV injection given IM in the RD.  Pt to return in 4 months for 3rd and final injection.  ([facility], [facility])

## 2024-12-06 ENCOUNTER — Ambulatory Visit

## 2024-12-30 ENCOUNTER — Ambulatory Visit: Payer: Self-pay | Admitting: Nurse Practitioner

## 2025-03-20 ENCOUNTER — Ambulatory Visit
